# Patient Record
Sex: Male | Born: 1955
Health system: Southern US, Community
[De-identification: ages and names within clinical notes are randomized; demographics above are authoritative.]

## PROBLEM LIST (undated history)

## (undated) DIAGNOSIS — N08 Glomerular disorders in diseases classified elsewhere: Secondary | ICD-10-CM

## (undated) DIAGNOSIS — I10 Essential (primary) hypertension: Secondary | ICD-10-CM

---

## 2007-02-24 ENCOUNTER — Emergency Department (HOSPITAL_COMMUNITY): Admission: EM | Admit: 2007-02-24 | Discharge: 2007-02-24 | Payer: Self-pay | Admitting: Emergency Medicine

## 2019-06-16 ENCOUNTER — Ambulatory Visit: Payer: Medicare HMO | Attending: Internal Medicine

## 2019-06-16 DIAGNOSIS — Z23 Encounter for immunization: Secondary | ICD-10-CM | POA: Insufficient documentation

## 2019-06-16 NOTE — Progress Notes (Signed)
   Covid-19 Vaccination Clinic  Name:  Sergio Sims    MRN: [id number] DOB: November 26, 1955  06/16/2019  Mr. Sergio Sims was observed post Covid-19 immunization for 15 minutes without incidence. He was provided with Vaccine Information Sheet and instruction to access the V-Safe system.   Mr. Sergio Sims was instructed to call 911 with any severe reactions post vaccine: Marland Kitchen Difficulty breathing  . Swelling of your face and throat  . A fast heartbeat  . A bad rash all over your body  . Dizziness and weakness    Immunizations Administered    Name Date Dose VIS Date Route   Pfizer COVID-19 Vaccine 06/16/2019  8:17 AM 0.3 mL 03/31/2019 Intramuscular   Manufacturer: [place]   Lot: [id number]   [place]: [id number]

## 2019-07-11 ENCOUNTER — Ambulatory Visit: Payer: Medicare HMO | Attending: Internal Medicine

## 2019-07-12 ENCOUNTER — Ambulatory Visit: Payer: Self-pay | Attending: Internal Medicine

## 2019-07-12 DIAGNOSIS — Z23 Encounter for immunization: Secondary | ICD-10-CM

## 2019-07-12 NOTE — Progress Notes (Signed)
   Covid-19 Vaccination Clinic  Name:  Sergio Sims    MRN: [id number] DOB: [birth year]  07/12/2019  Mr. Sergio Sims was observed post Covid-19 immunization for 15 minutes without incident. He was provided with Vaccine Information Sheet and instruction to access the V-Safe system.   Mr. Sergio Sims was instructed to call 911 with any severe reactions post vaccine: Marland Kitchen Difficulty breathing  . Swelling of face and throat  . A fast heartbeat  . A bad rash all over body  . Dizziness and weakness   Immunizations Administered    Name Date Dose VIS Date Route   Pfizer COVID-19 Vaccine 07/12/2019  9:33 AM 0.3 mL 03/31/2019 Intramuscular   Manufacturer: [place]   Lot: [id number]   [place]: [id number]

## 2020-04-25 DIAGNOSIS — G4733 Obstructive sleep apnea (adult) (pediatric): Secondary | ICD-10-CM | POA: Diagnosis not present

## 2020-05-18 DIAGNOSIS — G4733 Obstructive sleep apnea (adult) (pediatric): Secondary | ICD-10-CM | POA: Diagnosis not present

## 2020-06-18 DIAGNOSIS — Z23 Encounter for immunization: Secondary | ICD-10-CM | POA: Diagnosis not present

## 2020-06-18 DIAGNOSIS — I10 Essential (primary) hypertension: Secondary | ICD-10-CM | POA: Diagnosis not present

## 2020-06-18 DIAGNOSIS — N529 Male erectile dysfunction, unspecified: Secondary | ICD-10-CM | POA: Diagnosis not present

## 2020-06-18 DIAGNOSIS — R972 Elevated prostate specific antigen [PSA]: Secondary | ICD-10-CM | POA: Diagnosis not present

## 2020-06-20 DIAGNOSIS — L814 Other melanin hyperpigmentation: Secondary | ICD-10-CM | POA: Diagnosis not present

## 2020-06-20 DIAGNOSIS — L570 Actinic keratosis: Secondary | ICD-10-CM | POA: Diagnosis not present

## 2020-06-20 DIAGNOSIS — L905 Scar conditions and fibrosis of skin: Secondary | ICD-10-CM | POA: Diagnosis not present

## 2020-06-20 DIAGNOSIS — L821 Other seborrheic keratosis: Secondary | ICD-10-CM | POA: Diagnosis not present

## 2020-06-20 DIAGNOSIS — D1801 Hemangioma of skin and subcutaneous tissue: Secondary | ICD-10-CM | POA: Diagnosis not present

## 2020-06-20 DIAGNOSIS — D225 Melanocytic nevi of trunk: Secondary | ICD-10-CM | POA: Diagnosis not present

## 2020-06-20 DIAGNOSIS — Z85828 Personal history of other malignant neoplasm of skin: Secondary | ICD-10-CM | POA: Diagnosis not present

## 2020-08-13 DIAGNOSIS — M549 Dorsalgia, unspecified: Secondary | ICD-10-CM | POA: Diagnosis not present

## 2020-08-13 DIAGNOSIS — R5383 Other fatigue: Secondary | ICD-10-CM | POA: Diagnosis not present

## 2020-08-13 DIAGNOSIS — R319 Hematuria, unspecified: Secondary | ICD-10-CM | POA: Diagnosis not present

## 2020-08-13 DIAGNOSIS — R61 Generalized hyperhidrosis: Secondary | ICD-10-CM | POA: Diagnosis not present

## 2020-08-19 DIAGNOSIS — J90 Pleural effusion, not elsewhere classified: Secondary | ICD-10-CM | POA: Diagnosis not present

## 2020-08-19 DIAGNOSIS — R918 Other nonspecific abnormal finding of lung field: Secondary | ICD-10-CM | POA: Diagnosis not present

## 2020-08-20 DIAGNOSIS — R319 Hematuria, unspecified: Secondary | ICD-10-CM | POA: Diagnosis not present

## 2020-08-20 DIAGNOSIS — R7989 Other specified abnormal findings of blood chemistry: Secondary | ICD-10-CM | POA: Diagnosis not present

## 2020-08-20 DIAGNOSIS — R61 Generalized hyperhidrosis: Secondary | ICD-10-CM | POA: Diagnosis not present

## 2020-08-22 DIAGNOSIS — R918 Other nonspecific abnormal finding of lung field: Secondary | ICD-10-CM | POA: Diagnosis not present

## 2020-08-22 DIAGNOSIS — J918 Pleural effusion in other conditions classified elsewhere: Secondary | ICD-10-CM | POA: Diagnosis not present

## 2020-08-23 DIAGNOSIS — J984 Other disorders of lung: Secondary | ICD-10-CM | POA: Diagnosis not present

## 2020-08-23 DIAGNOSIS — R7989 Other specified abnormal findings of blood chemistry: Secondary | ICD-10-CM | POA: Diagnosis not present

## 2020-08-23 DIAGNOSIS — R61 Generalized hyperhidrosis: Secondary | ICD-10-CM | POA: Diagnosis not present

## 2020-08-27 DIAGNOSIS — R61 Generalized hyperhidrosis: Secondary | ICD-10-CM | POA: Diagnosis not present

## 2020-08-27 DIAGNOSIS — R918 Other nonspecific abnormal finding of lung field: Secondary | ICD-10-CM | POA: Diagnosis not present

## 2020-08-27 DIAGNOSIS — J984 Other disorders of lung: Secondary | ICD-10-CM | POA: Diagnosis not present

## 2020-09-02 DIAGNOSIS — Z0100 Encounter for examination of eyes and vision without abnormal findings: Secondary | ICD-10-CM | POA: Diagnosis not present

## 2020-09-27 DIAGNOSIS — J984 Other disorders of lung: Secondary | ICD-10-CM | POA: Diagnosis not present

## 2020-09-27 DIAGNOSIS — I358 Other nonrheumatic aortic valve disorders: Secondary | ICD-10-CM | POA: Diagnosis not present

## 2020-09-27 DIAGNOSIS — I2510 Atherosclerotic heart disease of native coronary artery without angina pectoris: Secondary | ICD-10-CM | POA: Diagnosis not present

## 2020-09-27 DIAGNOSIS — R918 Other nonspecific abnormal finding of lung field: Secondary | ICD-10-CM | POA: Diagnosis not present

## 2020-10-04 DIAGNOSIS — Z87891 Personal history of nicotine dependence: Secondary | ICD-10-CM | POA: Diagnosis not present

## 2020-10-04 DIAGNOSIS — R918 Other nonspecific abnormal finding of lung field: Secondary | ICD-10-CM | POA: Diagnosis not present

## 2020-10-07 DIAGNOSIS — Z0100 Encounter for examination of eyes and vision without abnormal findings: Secondary | ICD-10-CM | POA: Diagnosis not present

## 2020-10-29 DIAGNOSIS — H6593 Unspecified nonsuppurative otitis media, bilateral: Secondary | ICD-10-CM | POA: Diagnosis not present

## 2020-10-29 DIAGNOSIS — G629 Polyneuropathy, unspecified: Secondary | ICD-10-CM | POA: Diagnosis not present

## 2020-10-29 DIAGNOSIS — H9191 Unspecified hearing loss, right ear: Secondary | ICD-10-CM | POA: Diagnosis not present

## 2020-10-29 DIAGNOSIS — M940 Chondrocostal junction syndrome [Tietze]: Secondary | ICD-10-CM | POA: Diagnosis not present

## 2020-10-29 DIAGNOSIS — R0600 Dyspnea, unspecified: Secondary | ICD-10-CM | POA: Diagnosis not present

## 2020-10-29 DIAGNOSIS — D509 Iron deficiency anemia, unspecified: Secondary | ICD-10-CM | POA: Diagnosis not present

## 2020-10-29 DIAGNOSIS — R7989 Other specified abnormal findings of blood chemistry: Secondary | ICD-10-CM | POA: Diagnosis not present

## 2020-10-29 DIAGNOSIS — R5383 Other fatigue: Secondary | ICD-10-CM | POA: Diagnosis not present

## 2020-10-29 DIAGNOSIS — R634 Abnormal weight loss: Secondary | ICD-10-CM | POA: Diagnosis not present

## 2020-10-31 DIAGNOSIS — R7989 Other specified abnormal findings of blood chemistry: Secondary | ICD-10-CM | POA: Diagnosis not present

## 2020-10-31 DIAGNOSIS — D509 Iron deficiency anemia, unspecified: Secondary | ICD-10-CM | POA: Diagnosis not present

## 2020-10-31 DIAGNOSIS — G629 Polyneuropathy, unspecified: Secondary | ICD-10-CM | POA: Diagnosis not present

## 2020-11-15 ENCOUNTER — Other Ambulatory Visit: Payer: Self-pay

## 2020-11-15 ENCOUNTER — Emergency Department (HOSPITAL_BASED_OUTPATIENT_CLINIC_OR_DEPARTMENT_OTHER): Payer: Medicare HMO

## 2020-11-15 ENCOUNTER — Emergency Department (HOSPITAL_BASED_OUTPATIENT_CLINIC_OR_DEPARTMENT_OTHER): Payer: Medicare HMO | Admitting: Radiology

## 2020-11-15 ENCOUNTER — Encounter (HOSPITAL_BASED_OUTPATIENT_CLINIC_OR_DEPARTMENT_OTHER): Payer: Self-pay | Admitting: Emergency Medicine

## 2020-11-15 ENCOUNTER — Observation Stay (HOSPITAL_BASED_OUTPATIENT_CLINIC_OR_DEPARTMENT_OTHER)
Admission: EM | Admit: 2020-11-15 | Discharge: 2020-11-17 | Disposition: A | Discharge status: Home / Self Care | Payer: Medicare HMO | Attending: Internal Medicine | Admitting: Internal Medicine

## 2020-11-15 DIAGNOSIS — Z20822 Contact with and (suspected) exposure to covid-19: Secondary | ICD-10-CM | POA: Diagnosis not present

## 2020-11-15 DIAGNOSIS — R634 Abnormal weight loss: Secondary | ICD-10-CM | POA: Diagnosis not present

## 2020-11-15 DIAGNOSIS — N179 Acute kidney failure, unspecified: Secondary | ICD-10-CM | POA: Diagnosis not present

## 2020-11-15 DIAGNOSIS — G629 Polyneuropathy, unspecified: Secondary | ICD-10-CM | POA: Diagnosis not present

## 2020-11-15 DIAGNOSIS — Z79899 Other long term (current) drug therapy: Secondary | ICD-10-CM | POA: Insufficient documentation

## 2020-11-15 DIAGNOSIS — I10 Essential (primary) hypertension: Secondary | ICD-10-CM | POA: Diagnosis not present

## 2020-11-15 DIAGNOSIS — H2000 Unspecified acute and subacute iridocyclitis: Secondary | ICD-10-CM | POA: Diagnosis not present

## 2020-11-15 DIAGNOSIS — D649 Anemia, unspecified: Secondary | ICD-10-CM | POA: Diagnosis not present

## 2020-11-15 DIAGNOSIS — H209 Unspecified iridocyclitis: Secondary | ICD-10-CM | POA: Diagnosis not present

## 2020-11-15 DIAGNOSIS — R911 Solitary pulmonary nodule: Secondary | ICD-10-CM | POA: Diagnosis not present

## 2020-11-15 DIAGNOSIS — R3129 Other microscopic hematuria: Secondary | ICD-10-CM | POA: Diagnosis not present

## 2020-11-15 DIAGNOSIS — R7989 Other specified abnormal findings of blood chemistry: Secondary | ICD-10-CM | POA: Diagnosis not present

## 2020-11-15 DIAGNOSIS — J90 Pleural effusion, not elsewhere classified: Secondary | ICD-10-CM | POA: Diagnosis not present

## 2020-11-15 DIAGNOSIS — R5383 Other fatigue: Secondary | ICD-10-CM | POA: Diagnosis not present

## 2020-11-15 DIAGNOSIS — N19 Unspecified kidney failure: Secondary | ICD-10-CM | POA: Diagnosis not present

## 2020-11-15 DIAGNOSIS — R799 Abnormal finding of blood chemistry, unspecified: Secondary | ICD-10-CM | POA: Diagnosis not present

## 2020-11-15 DIAGNOSIS — N289 Disorder of kidney and ureter, unspecified: Secondary | ICD-10-CM

## 2020-11-15 DIAGNOSIS — D3132 Benign neoplasm of left choroid: Secondary | ICD-10-CM | POA: Diagnosis not present

## 2020-11-15 LAB — URINALYSIS, ROUTINE W REFLEX MICROSCOPIC
Bilirubin Urine: NEGATIVE
Glucose, UA: NEGATIVE mg/dL
Ketones, ur: NEGATIVE mg/dL
Nitrite: NEGATIVE
Protein, ur: 100 mg/dL — AB
Specific Gravity, Urine: 1.018 (ref 1.005–1.030)
pH: 5.5 (ref 5.0–8.0)

## 2020-11-15 LAB — CBC
HCT: 30.6 % — ABNORMAL LOW (ref 39.0–52.0)
Hemoglobin: 9.7 g/dL — ABNORMAL LOW (ref 13.0–17.0)
MCH: 26.7 pg (ref 26.0–34.0)
MCHC: 31.7 g/dL (ref 30.0–36.0)
MCV: 84.3 fL (ref 80.0–100.0)
Platelets: 360 10*3/uL (ref 150–400)
RBC: 3.63 MIL/uL — ABNORMAL LOW (ref 4.22–5.81)
RDW: 16.4 % — ABNORMAL HIGH (ref 11.5–15.5)
WBC: 9 10*3/uL (ref 4.0–10.5)
nRBC: 0 % (ref 0.0–0.2)

## 2020-11-15 LAB — BASIC METABOLIC PANEL
Anion gap: 13 (ref 5–15)
BUN: 32 mg/dL — ABNORMAL HIGH (ref 8–23)
CO2: 22 mmol/L (ref 22–32)
Calcium: 9 mg/dL (ref 8.9–10.3)
Chloride: 101 mmol/L (ref 98–111)
Creatinine, Ser: 2.2 mg/dL — ABNORMAL HIGH (ref 0.61–1.24)
GFR, Estimated: 32 mL/min — ABNORMAL LOW (ref >60)
Glucose, Bld: 111 mg/dL — ABNORMAL HIGH (ref 70–99)
Potassium: 4.2 mmol/L (ref 3.5–5.1)
Sodium: 136 mmol/L (ref 135–145)

## 2020-11-15 LAB — RESP PANEL BY RT-PCR (FLU A&B, COVID) ARPGX2
Influenza A by PCR: NEGATIVE
Influenza B by PCR: NEGATIVE
SARS Coronavirus 2 by RT PCR: NEGATIVE

## 2020-11-15 LAB — CREATININE, URINE, RANDOM: Creatinine, Urine: 158.9 mg/dL

## 2020-11-15 LAB — SODIUM, URINE, RANDOM: Sodium, Ur: 52 mmol/L

## 2020-11-15 MED ORDER — SODIUM CHLORIDE 0.9 % IV BOLUS
1000.0000 mL | Freq: Once | INTRAVENOUS | Status: AC
  Administered 2020-11-15: 1000 mL via INTRAVENOUS

## 2020-11-15 NOTE — ED Notes (Signed)
States has been having a work up with his MD and told to come here for abnormal labs regarding his kidneys.  Denies SOB or any pain.  States has been having weakness for weeks.

## 2020-11-15 NOTE — ED Triage Notes (Signed)
Pt presents to ED POV. Pt reports that he was sent he because he was told by PCP he was going into renal failure. Pt pt denies any edema and/or difficulty breathing. Reports decreased urine output

## 2020-11-15 NOTE — Progress Notes (Signed)
New Admission Note:   Arrival Method: Arrived from [place] ED Mental Orientation: Alert and oriented x4 Telemetry: Box #14 Assessment: Completed Skin: Intact IV: Lt FA Pain: 0/10 Tubes: N/A Safety Measures: Safety Fall Prevention Plan has been discussed.  Admission: Completed 5MW Orientation: Patient has been oriented to the room, unit and staff.  Family: None at bedside  Orders have been reviewed and implemented. Will continue to monitor the patient. Call light has been placed within reach and bed alarm has been activated.   Breyonna Nault [employer], RN-BC Phone number: [phone]

## 2020-11-15 NOTE — ED Notes (Signed)
Attempt report to floor RN.  States will call back.  Handoff report given to [facility]

## 2020-11-15 NOTE — ED Notes (Signed)
Called [facility] to transport patient to [facility]M room 14

## 2020-11-15 NOTE — ED Notes (Signed)
US done

## 2020-11-15 NOTE — ED Provider Notes (Signed)
[facility] EMERGENCY DEPT Provider Note   CSN: [id number] Arrival date & time: 11/15/20  1508     History Chief Complaint  Patient presents with   abnormal labs    Sergio Sims is a 65 y.o. male.  HPI  65-year-old male presents the emergency department concern for new renal failure found on outpatient labs.  Patient reports over the last couple months since April he has been struggling with a right sided lung infection.  He has been on multiple courses of antibiotics.  Since this he has never felt back to normal.  He said decreased appetite, weight loss, decreased urine production.  And advised outpatient labs which were noted to be abnormal and he was referred here.  Patient denies any fever but endorses fatigue, chills.  Intermittent cough but denies any nausea/vomiting/diarrhea.  Admits to decreased urine output.  No specific chest pain or abdominal pain.  History reviewed. No pertinent past medical history.  There are no problems to display for this patient.   History reviewed. No pertinent surgical history.     History reviewed. No pertinent family history.     Home Medications Prior to Admission medications   Not on File    Allergies    Patient has no allergy information on record.  Review of Systems   Review of Systems  Constitutional:  Positive for appetite change, fatigue and unexpected weight change. Negative for chills and fever.  HENT:  Negative for congestion.   Eyes:  Negative for visual disturbance.  Respiratory:  Positive for cough. Negative for shortness of breath.   Cardiovascular:  Negative for chest pain, palpitations and leg swelling.  Gastrointestinal:  Negative for abdominal pain, diarrhea and vomiting.  Genitourinary:  Negative for dysuria.  Musculoskeletal:  Negative for back pain and neck pain.  Skin:  Negative for rash.  Neurological:  Positive for headaches.   Physical Exam Updated Vital Signs BP (!) 158/69 (BP  Location: Right Arm)   Pulse 75   Temp 98.6 F (37 C)   Resp 16   Ht 6' 2" (1.88 m)   Wt 92.5 kg   SpO2 100%   BMI 26.19 kg/m   Physical Exam Vitals and nursing note reviewed.  Constitutional:      General: He is not in acute distress.    Appearance: Normal appearance.  HENT:     Head: Normocephalic.     Mouth/Throat:     Mouth: Mucous membranes are moist.  Cardiovascular:     Rate and Rhythm: Normal rate.  Pulmonary:     Effort: Pulmonary effort is normal. No respiratory distress.     Comments: Diminished breath sounds on the right Abdominal:     Palpations: Abdomen is soft.     Tenderness: There is no abdominal tenderness.  Musculoskeletal:        General: No swelling or deformity.     Cervical back: No rigidity or tenderness.  Skin:    General: Skin is warm.  Neurological:     Mental Status: He is alert and oriented to person, place, and time. Mental status is at baseline.  Psychiatric:        Mood and Affect: Mood normal.    ED Results / Procedures / Treatments   Labs (all labs ordered are listed, but only abnormal results are displayed) Labs Reviewed  CBC - Abnormal; Notable for the following components:      Result Value   RBC 3.63 (*)    Hemoglobin 9.7 (*)      HCT 30.6 (*)    RDW 16.4 (*)    All other components within normal limits  BASIC METABOLIC PANEL - Abnormal; Notable for the following components:   Glucose, Bld 111 (*)    BUN 32 (*)    Creatinine, Ser 2.20 (*)    GFR, Estimated 32 (*)    All other components within normal limits  URINALYSIS, ROUTINE W REFLEX MICROSCOPIC - Abnormal; Notable for the following components:   Hgb urine dipstick LARGE (*)    Protein, ur 100 (*)    Leukocytes,Ua TRACE (*)    All other components within normal limits    EKG None  Radiology No results found.  Procedures Procedures   Medications Ordered in ED Medications - No data to display  ED Course  I have reviewed the triage vital signs and the  nursing notes.  Pertinent labs & imaging results that were available during my care of the patient were reviewed by me and considered in my medical decision making (see chart for details).    MDM Rules/Calculators/A&P                           65-year-old male presents the emergency department with knee dysfunction on outpatient labs.  Patient has been fighting a right-sided lung infection for the past couple months.  Since then has had a significant decline, weight loss and decreased p.o. intake.  Of note his ESR and CRP has been elevated as an outpatient.  Most of his care has been at [facility].  Vitals are stable here.  Lab work here confirms renal dysfunction, normal white blood cells, his baseline creatinine appears to be around 1.2 and today his creatinine is 2.2.  Urinalysis has leukocytes and blood but no infection.  Renal ultrasound is reassuring.  Chest x-ray shows right-sided pleural effusion.  Plan for hydration and urine studies as well as admission for further evaluation and treatment.  Patients evaluation and results requires admission for further treatment and care. Patient agrees with admission plan, offers no new complaints and is stable/unchanged at time of admit.  Final Clinical Impression(s) / ED Diagnoses Final diagnoses:  None    Rx / DC Orders ED Discharge Orders     None        Horton, Kristie M, DO 11/15/20 2031  

## 2020-11-15 NOTE — ED Notes (Signed)
Report given to Floor Rn 60M at [facility] cone.

## 2020-11-16 DIAGNOSIS — N179 Acute kidney failure, unspecified: Secondary | ICD-10-CM

## 2020-11-16 DIAGNOSIS — I10 Essential (primary) hypertension: Secondary | ICD-10-CM | POA: Diagnosis not present

## 2020-11-16 DIAGNOSIS — D649 Anemia, unspecified: Secondary | ICD-10-CM | POA: Diagnosis not present

## 2020-11-16 LAB — BASIC METABOLIC PANEL
Anion gap: 9 (ref 5–15)
BUN: 29 mg/dL — ABNORMAL HIGH (ref 8–23)
CO2: 22 mmol/L (ref 22–32)
Calcium: 8.5 mg/dL — ABNORMAL LOW (ref 8.9–10.3)
Chloride: 104 mmol/L (ref 98–111)
Creatinine, Ser: 2.31 mg/dL — ABNORMAL HIGH (ref 0.61–1.24)
GFR, Estimated: 30 mL/min — ABNORMAL LOW (ref >60)
Glucose, Bld: 104 mg/dL — ABNORMAL HIGH (ref 70–99)
Potassium: 4.4 mmol/L (ref 3.5–5.1)
Sodium: 135 mmol/L (ref 135–145)

## 2020-11-16 LAB — HEPATITIS PANEL, ACUTE
HCV Ab: NONREACTIVE
Hep A IgM: NONREACTIVE
Hep B C IgM: NONREACTIVE
Hepatitis B Surface Ag: NONREACTIVE

## 2020-11-16 LAB — CBC
HCT: 30.1 % — ABNORMAL LOW (ref 39.0–52.0)
Hemoglobin: 9.4 g/dL — ABNORMAL LOW (ref 13.0–17.0)
MCH: 27.1 pg (ref 26.0–34.0)
MCHC: 31.2 g/dL (ref 30.0–36.0)
MCV: 86.7 fL (ref 80.0–100.0)
Platelets: 317 10*3/uL (ref 150–400)
RBC: 3.47 MIL/uL — ABNORMAL LOW (ref 4.22–5.81)
RDW: 16 % — ABNORMAL HIGH (ref 11.5–15.5)
WBC: 6.1 10*3/uL (ref 4.0–10.5)
nRBC: 0 % (ref 0.0–0.2)

## 2020-11-16 LAB — PROTEIN / CREATININE RATIO, URINE
Creatinine, Urine: 78.45 mg/dL
Protein Creatinine Ratio: 0.69 mg/mg{Cre} — ABNORMAL HIGH (ref 0.00–0.15)
Total Protein, Urine: 54 mg/dL

## 2020-11-16 LAB — HIV ANTIBODY (ROUTINE TESTING W REFLEX): HIV Screen 4th Generation wRfx: NONREACTIVE

## 2020-11-16 MED ORDER — ENOXAPARIN SODIUM 40 MG/0.4ML IJ SOSY
40.0000 mg | PREFILLED_SYRINGE | Freq: Every day | INTRAMUSCULAR | Status: DC
  Administered 2020-11-16: 40 mg via SUBCUTANEOUS
  Filled 2020-11-16 (×3): qty 0.4

## 2020-11-16 MED ORDER — SODIUM CHLORIDE 0.9 % IV SOLN: INTRAVENOUS | Status: DC

## 2020-11-16 MED ORDER — AMLODIPINE BESYLATE 5 MG PO TABS
5.0000 mg | ORAL_TABLET | Freq: Every day | ORAL | Status: DC
  Administered 2020-11-17: 5 mg via ORAL
  Filled 2020-11-16 (×3): qty 1

## 2020-11-16 MED ORDER — SODIUM CHLORIDE 0.9 % IV BOLUS
1000.0000 mL | Freq: Once | INTRAVENOUS | Status: AC
  Administered 2020-11-16: 1000 mL via INTRAVENOUS

## 2020-11-16 MED ORDER — LACTATED RINGERS IV SOLN: INTRAVENOUS | Status: DC

## 2020-11-16 MED ORDER — ACETAMINOPHEN 325 MG PO TABS
650.0000 mg | ORAL_TABLET | Freq: Four times a day (QID) | ORAL | Status: DC | PRN
  Administered 2020-11-16 – 2020-11-17 (×4): 650 mg via ORAL
  Filled 2020-11-16 (×4): qty 2

## 2020-11-16 NOTE — Plan of Care (Signed)
  Problem: Education: Goal: Knowledge of General Education information will improve Description: Including pain rating scale, medication(s)/side effects and non-pharmacologic comfort measures Outcome: Progressing   Problem: Clinical Measurements: Goal: Ability to maintain clinical measurements within normal limits will improve Outcome: Progressing   Problem: Activity: Goal: Risk for activity intolerance will decrease Outcome: Progressing   Problem: Nutrition: Goal: Adequate nutrition will be maintained Outcome: Progressing   Problem: Education: Goal: Knowledge of disease and its progression will improve Outcome: Progressing

## 2020-11-16 NOTE — Care Management Obs Status (Signed)
[place] NOTIFICATION   Patient Details  Name: Sergio Sims MRN: [id number] Date of Birth: 11/12/1955   Medicare Observation Status Notification Given:  Yes    Bartholomew Crews, RN 11/16/2020, 6:21 PM

## 2020-11-16 NOTE — Consult Note (Addendum)
Renal Service Consult Note [facility]  Sergio Sims 11/16/2020 [name], MD Requesting Physician: Dr Nevada Crane, C.   Reason for Consult: Renal failure HPI: The patient is a 65 y.o. year-old w/ hx of HTN sent to ED at [facility] by his PCP for elevated creatinine. Pt c/o of dec'd appetite since dx of PNA 2 mos ago, also assoc wt loss 25 lbs, DOE w/ gen weakness.  In ED pt got 1 L bolus and tx'd to [facility] for admission. Renal US showed normal appearing kidneys. Was on acei at home , being held now. BP in ED was 160/80, R 75, RR 17, afeb, 100% on RA, creat 2.31, BUN 29  , K 4.4.  Asked to see for renal failure.   Pt had PNA about 6 wks ago, was not in hospital, took about 2 wks of po abx and recovered but since then has had multiple issues including gen weakness, wt loss 25 lbs, joint pains (took 10 d pred taper which helped), some numbness in the feet. No hx kidney issues, no change in color such as tea colored, urine is yellow. No difficutly voiding. Has not been eating well. Was taking acei inhibitor at home, now is on hold.     ROS - denies CP, no joint pain, no HA, no blurry vision, no rash, no diarrhea, no nausea/ vomiting, no dysuria, no difficulty voiding   Past Medical History History reviewed. No pertinent past medical history. Past Surgical History History reviewed. No pertinent surgical history. Family History History reviewed. No pertinent family history. Social History  has no history on file for tobacco use, alcohol use, and drug use. Allergies Not on File Home medications Prior to Admission medications   Not on File     Vitals:   11/15/20 1745 11/15/20 1915 11/15/20 2353 11/16/20 0800  BP: (!) 158/69 (!) 151/79 (!) 163/83 (!) 145/74  Pulse: 75 81 74 73  Resp: 16 18 17 19   Temp:   98.1 F (36.7 C) 97.7 F (36.5 C)  TempSrc:   Oral Oral  SpO2: 100% 98% 100% 100%  Weight:   91.7 kg   Height:   6' 1"  (1.854 m)    Exam Gen alert, no distress No  rash, cyanosis or gangrene Sclera anicteric, throat clear  No jvd or bruits Chest clear bilat to bases, no rales/ wheezing RRR no MRG Abd soft ntnd no mass or ascites +bs GU normal MS no joint effusions or deformity Ext no LE or UE edema, no wounds or ulcers Neuro is alert, Ox 3 , nf     Home meds include - ace inhibitor, now on hold    CXR - Previously seen small nodules by CT are not well visualized by plain film and should be followed by CT. Heart is normal size. Probable scarring in the lung bases. Small right pleural effusion again suspected, unchanged.    Renal US - 12 to 13 cm kidneys w/o hydro, + ^'d echo bilat, enlarged prostate    UA - 100 prot, gran casts, 11-20 rbcs, 0-5 wbc     UNa 52,  UCr 159     NO old creatinine      Na 135  K 4.4 CO2 22 BUN 29  Creat 2.31  eGFR 30       WBC 9K  Hb 9.7   plt 360     Assessment/ Plan: Renal failure - presumably acute, no old creat/ no hx renal failure. H/o HTN  only, acei is now on hold.  Has post PNA symptoms including 20 lb wt loss, joint pain and fatigue. UA shows gran casts and some microhematuria, 100 prot by dip. No obstruction on [place]. Will quantify proteinuria and send off serologies, he could have a GN post infectious. Could be vol depleted +ACEi is the other likely option. Will increase IVF's for now, f/u creat in am. Will follow.  HTN - no acei/ ARB, other agents ok Volume - slightly dry or euvolemic on exam Anemia - Hb 9.7, MCV normal      Sergio Buren Havey  MD 11/16/2020, 10:30 AM  Recent Labs  Lab 11/15/20 1525 11/16/20 0822  WBC 9.0 6.1  HGB 9.7* 9.4*   Recent Labs  Lab 11/15/20 1525 11/16/20 0204  K 4.2 4.4  BUN 32* 29*  CREATININE 2.20* 2.31*  CALCIUM 9.0 8.5*

## 2020-11-16 NOTE — H&P (Addendum)
History and Physical  Sergio Sims LFY:[id number] DOB: [birth year] DOA: 11/15/2020  Referring physician: Dr. Cyd Silence, [facility] PCP: Pcp, No  Outpatient Specialists: Pulmonary Patient coming from: Home through [facility] ED.  Chief Complaint: Abnormal lab.  HPI: Sergio Sims is a 65 y.o. male with medical history significant for essential hypertension, pulmonary nodules, right-sided pleural effusion, followed at [facility] pulmonology who was initially seen at [facility] ED at [facility] request due to abnormal lab with elevated creatinine.  Reports poor oral intake, loss of appetite, since diagnosis of pneumonia 2 months ago, associated with generalized weakness, exercise intolerance, dyspnea with minimal exertion, unintentional weight loss 25 pounds in the last 2 months.  Patient had a renal ultrasound done in the ED and received 1 L IV fluid hydration, normal saline bolus.  Was subsequently transferred to [facility] for further evaluation and management.  Renal ultrasound revealed evidence of medical renal disease.  No hydronephrosis or nephrolithiasis.  Proteinuria noted on UA.  He is on ACE inhibitor which were held due to AKI.  Admitted to the hospitalist service.  ED Course:  Temperature 98.6.  BP 163/83, pulse 74, respiratory 17, O2 saturation 100% on room air.  Lab studies remarkable for serum sodium 135, potassium 4.4, serum bicarb 22, glucose 104, BUN 29, creatinine 2.31, GFR 30.  Review of Systems: Review of systems as noted in the HPI. All other systems reviewed and are negative.   Social History:  has no history on file for tobacco use, alcohol use, and drug use.  History reviewed. No pertinent family history.   Home medications: Takes an ACE inhibitor.   Physical Exam: BP (!) 163/83 (BP Location: Right Arm)   Pulse 74   Temp 98.1 F (36.7 C) (Oral)   Resp 17   Ht 6\' 1"  (1.854 m)   Wt 91.7 kg   SpO2 100%   BMI 26.67 kg/m   General: 65 y.o. year-old male well  developed well nourished in no acute distress.  Alert and oriented x3. Cardiovascular: Regular rate and rhythm with no rubs or gallops.  No thyromegaly or JVD noted.  No lower extremity edema. 2/4 pulses in all 4 extremities. Respiratory: Clear to auscultation with no wheezes or rales. Good inspiratory effort. Abdomen: Soft nontender nondistended with normal bowel sounds x4 quadrants. Muskuloskeletal: No cyanosis, clubbing or edema noted bilaterally Neuro: CN II-XII intact, strength, sensation, reflexes Skin: No ulcerative lesions noted or rashes Psychiatry: Judgement and insight appear normal. Mood is appropriate for condition and setting          Labs on Admission:  Basic Metabolic Panel: Recent Labs  Lab 11/15/20 1525  NA 136  K 4.2  CL 101  CO2 22  GLUCOSE 111*  BUN 32*  CREATININE 2.20*  CALCIUM 9.0   Liver Function Tests: No results for input(s): AST, ALT, ALKPHOS, BILITOT, PROT, ALBUMIN in the last 168 hours. No results for input(s): LIPASE, AMYLASE in the last 168 hours. No results for input(s): AMMONIA in the last 168 hours. CBC: Recent Labs  Lab 11/15/20 1525  WBC 9.0  HGB 9.7*  HCT 30.6*  MCV 84.3  PLT 360   Cardiac Enzymes: No results for input(s): CKTOTAL, CKMB, CKMBINDEX, TROPONINI in the last 168 hours.  BNP (last 3 results) No results for input(s): BNP in the last 8760 hours.  ProBNP (last 3 results) No results for input(s): PROBNP in the last 8760 hours.  CBG: No results for input(s): GLUCAP in the last 168 hours.  Radiological Exams  on Admission: DG Chest 2 View  Result Date: 11/15/2020 CLINICAL DATA:  Recent infection EXAM: CHEST - 2 VIEW COMPARISON:  CT 09/27/2020 FINDINGS: Previously seen small nodules by CT are not well visualized by plain film and should be followed by CT. Heart is normal size. Probable scarring in the lung bases. Small right pleural effusion again suspected, unchanged. IMPRESSION: Bibasilar opacities, right greater than  left, favor scarring. Small right pleural effusion. Previously seen small pulmonary nodules not well visualized by plain films and can be followed with CT as clinically indicated. Electronically Signed   By: Rolm Baptise M.D.   On: 11/15/2020 19:07   US Renal  Result Date: 11/15/2020 CLINICAL DATA:  Renal failure. EXAM: RENAL / URINARY TRACT ULTRASOUND COMPLETE COMPARISON:  None. FINDINGS: Right Kidney: Renal measurements: 12.8 x 6.6 x 6.0 cm = volume: 262.7 mL. Echogenicity appears increased. Two simple cysts are seen measuring 1.0 and 2.3 cm. No solid mass or hydronephrosis visualized. Left Kidney: Renal measurements: 12.1 x 6.8 x 6.4 cm = volume: 276 mL. Echogenicity appears increased. 0.7 cm cyst noted. No solid mass or hydronephrosis visualized. Bladder: Appears normal for degree of bladder distention. Other: Prostate gland appears enlarged. IMPRESSION: Negative for hydronephrosis or acute abnormality. Increased cortical echogenicity compatible with medical renal disease. Electronically Signed   By: Inge Rise M.D.   On: 11/15/2020 18:54    EKG: I independently viewed the EKG done and my findings are as followed: None available at the time of this visit.  Assessment/Plan Present on Admission:  AKI (acute kidney injury) ([place])  Active Problems:   AKI (acute kidney injury) ([place])  AKI, no prior records to compare Presented with creatinine of 2.3 with GFR of 30, unclear baseline Renal ultrasound medical renal disease, no hydronephrosis. Avoid nephrotoxic agent, dehydration and hypotension. IV fluid hydration lactated Ringer 75 cc/h x 1 day. Monitor urine output Repeat BMP in the morning  Unintentional weight loss, unclear etiology Chronic normocytic anemia Presented with hemoglobin of 9.7 Self reported in the last year and half had a colonoscopy for which polyps were removed by Dr. Benson Norway. Monitor H&H Consider close follow-up appointment with Dr. Benson Norway after discharge  Iron  deficiency anemia Hemoglobin 9.7 Follows with GI outpatient No overt bleeding Monitor H&H  Essential hypertension Continue to hold off home oral ACE inhibitor Started on Norvasc 5 mg daily Continue to monitor vital signs   DVT prophylaxis: SCDs  Code Status: Full code  Family Communication: None at bedside  Disposition Plan: Admitted to [place] unit  Consults called: None  Admission status: Observation status   Status is: Observation   Dispo: The patient is from: Home.               Anticipated d/c is to: Home, possibly on 11/17/2020.              Patient currently not stable for discharge.   Difficult to place patient, not applicable.       Kayleen Memos MD [facility] Pager [phone]  If 7PM-7AM, please contact night-coverage [email or web address] Password [email or web address]  11/16/2020, 2:58 AM

## 2020-11-16 NOTE — Progress Notes (Signed)
Brief same-day note:  Patient is a 65 year old male with history of hypertension, pulm nodules, right-sided pleural effusion following with [facility] pulmonology who was sent as a direct admission for [place] after his PCP referred him to the ED for the evaluation of abnormal renal function.  Patient reported poor oral intake, loss of appetite, weakness, unintentional weight loss of 25 pounds in the last 2 months. Patient was recently treated with oral antibiotics for pneumonia by his pulmonologist. Renal ultrasound done in the emergency department was consistent with medical renal disease ,no hydronephrosis or nephrolithiasis.  Urinalysis with proteinuria.  He was also taking ACE inhibitor at home/but I do not see records on the med rec. Patient creatinine is in the range of 2.4 today.  And he is hemodynamically stable.  Potassium level is normal. I have requested for nephrology consultation.  His baseline creatinine is almost normal, ranging from 1-1.4. His AKI could be secondary to ACE inhibitor use or could be from acute interstitial nephritis secondary to recent antibiotic use. Urine sodium is more than 20, fluids have been stopped. He does not have any breathing problems.  Chest x-ray done on this admission showed bibasilar opacities favoring scaring, small right-sided pleural effusion,, no pneumonia.  He is saturating fine on room air and his lungs are clear on examination. Will continue current management.  We will check recommendation from nephrology.  Patient is eager to go home soon.

## 2020-11-17 DIAGNOSIS — D649 Anemia, unspecified: Secondary | ICD-10-CM | POA: Diagnosis not present

## 2020-11-17 DIAGNOSIS — I10 Essential (primary) hypertension: Secondary | ICD-10-CM | POA: Diagnosis not present

## 2020-11-17 DIAGNOSIS — N179 Acute kidney failure, unspecified: Secondary | ICD-10-CM | POA: Diagnosis not present

## 2020-11-17 LAB — CBC
HCT: 28.1 % — ABNORMAL LOW (ref 39.0–52.0)
Hemoglobin: 8.9 g/dL — ABNORMAL LOW (ref 13.0–17.0)
MCH: 27.2 pg (ref 26.0–34.0)
MCHC: 31.7 g/dL (ref 30.0–36.0)
MCV: 85.9 fL (ref 80.0–100.0)
Platelets: 339 10*3/uL (ref 150–400)
RBC: 3.27 MIL/uL — ABNORMAL LOW (ref 4.22–5.81)
RDW: 15.9 % — ABNORMAL HIGH (ref 11.5–15.5)
WBC: 6.7 10*3/uL (ref 4.0–10.5)
nRBC: 0 % (ref 0.0–0.2)

## 2020-11-17 LAB — BASIC METABOLIC PANEL
Anion gap: 7 (ref 5–15)
BUN: 27 mg/dL — ABNORMAL HIGH (ref 8–23)
CO2: 23 mmol/L (ref 22–32)
Calcium: 8.4 mg/dL — ABNORMAL LOW (ref 8.9–10.3)
Chloride: 106 mmol/L (ref 98–111)
Creatinine, Ser: 2.05 mg/dL — ABNORMAL HIGH (ref 0.61–1.24)
GFR, Estimated: 35 mL/min — ABNORMAL LOW (ref >60)
Glucose, Bld: 97 mg/dL (ref 70–99)
Potassium: 4.2 mmol/L (ref 3.5–5.1)
Sodium: 136 mmol/L (ref 135–145)

## 2020-11-17 LAB — C4 COMPLEMENT: Complement C4, Body Fluid: 36 mg/dL (ref 12–38)

## 2020-11-17 LAB — UREA NITROGEN, URINE: Urea Nitrogen, Ur: 632 mg/dL

## 2020-11-17 LAB — C3 COMPLEMENT: C3 Complement: 148 mg/dL (ref 82–167)

## 2020-11-17 LAB — ANTISTREPTOLYSIN O TITER: ASO: <20 IU/mL (ref 0.0–200.0)

## 2020-11-17 MED ORDER — SODIUM CHLORIDE 0.9 % IV SOLN
510.0000 mg | Freq: Once | INTRAVENOUS | Status: AC
  Administered 2020-11-17: 510 mg via INTRAVENOUS
  Filled 2020-11-17: qty 17

## 2020-11-17 MED ORDER — AMLODIPINE BESYLATE 5 MG PO TABS: 5.0000 mg | ORAL_TABLET | Freq: Every day | ORAL | 1 refills | Status: AC

## 2020-11-17 MED ORDER — FERROUS SULFATE 325 (65 FE) MG PO TABS: 325.0000 mg | ORAL_TABLET | Freq: Every day | ORAL | 1 refills | Status: AC

## 2020-11-17 NOTE — Plan of Care (Signed)
  Problem: Education: Goal: Knowledge of General Education information will improve Description: Including pain rating scale, medication(s)/side effects and non-pharmacologic comfort measures Outcome: Completed/Met   Problem: Health Behavior/Discharge Planning: Goal: Ability to manage health-related needs will improve Outcome: Completed/Met   Problem: Clinical Measurements: Goal: Ability to maintain clinical measurements within normal limits will improve Outcome: Completed/Met Goal: Will remain free from infection Outcome: Completed/Met Goal: Diagnostic test results will improve Outcome: Completed/Met Goal: Respiratory complications will improve Outcome: Completed/Met Goal: Cardiovascular complication will be avoided Outcome: Completed/Met   Problem: Activity: Goal: Risk for activity intolerance will decrease Outcome: Completed/Met   Problem: Nutrition: Goal: Adequate nutrition will be maintained Outcome: Completed/Met   Problem: Coping: Goal: Level of anxiety will decrease Outcome: Completed/Met   Problem: Elimination: Goal: Will not experience complications related to bowel motility Outcome: Completed/Met Goal: Will not experience complications related to urinary retention Outcome: Completed/Met   Problem: Pain Managment: Goal: General experience of comfort will improve Outcome: Completed/Met   Problem: Safety: Goal: Ability to remain free from injury will improve Outcome: Completed/Met   Problem: Skin Integrity: Goal: Risk for impaired skin integrity will decrease Outcome: Completed/Met   Problem: Education: Goal: Knowledge of disease and its progression will improve Outcome: Completed/Met   Problem: Health Behavior/Discharge Planning: Goal: Ability to manage health-related needs will improve Outcome: Completed/Met   Problem: Clinical Measurements: Goal: Complications related to the disease process or treatment will be avoided or minimized Outcome:  Completed/Met Goal: Dialysis access will remain free of complications Outcome: Completed/Met   Problem: Activity: Goal: Activity intolerance will improve Outcome: Completed/Met   Problem: Fluid Volume: Goal: Fluid volume balance will be maintained or improved Outcome: Completed/Met   Problem: Nutritional: Goal: Ability to make appropriate dietary choices will improve Outcome: Completed/Met   Problem: Respiratory: Goal: Respiratory symptoms related to disease process will be avoided Outcome: Completed/Met   Problem: Self-Concept: Goal: Body image disturbance will be avoided or minimized Outcome: Completed/Met   Problem: Urinary Elimination: Goal: Progression of disease will be identified and treated Outcome: Completed/Met

## 2020-11-17 NOTE — Progress Notes (Signed)
DISCHARGE NOTE HOME Aquilla Voiles to be discharged Home per MD order. Discussed prescriptions and follow up appointments with the patient. Prescriptions given to patient; medication list explained in detail. Patient verbalized understanding.  Skin clean, dry and intact without evidence of skin break down, no evidence of skin tears noted. IV catheter discontinued intact. Site without signs and symptoms of complications. Dressing and pressure applied. Pt denies pain at the site currently. No complaints noted.  Patient free of lines, drains, and wounds.   An After Visit Summary (AVS) was printed and given to the patient. Patient escorted via wheelchair, and discharged home via private auto.  Vira Agar, RN

## 2020-11-17 NOTE — Discharge Summary (Signed)
Physician Discharge Summary  Sergio Sims CZY:[id number] DOB: Aug 25, 1955 DOA: 11/15/2020  PCP: Pcp, No  Admit date: 11/15/2020 Discharge date: 11/17/2020  Admitted From: Home Disposition:  Home  Discharge Condition:Stable CODE STATUS:FULL Diet recommendation: Heart Healthy    Brief/Interim Summary:  Patient is a 65 year old male with history of hypertension, pulm nodules, right-sided pleural effusion following with [facility] pulmonology who was sent as a direct admission for [place] after his PCP referred him to the ED for the evaluation of abnormal renal function.  Patient reported poor oral intake, loss of appetite, weakness, unintentional weight loss of 25 pounds in the last 2 months. Patient was recently treated with oral antibiotics for pneumonia by his pulmonologist. Renal ultrasound done in the emergency department was consistent with medical renal disease ,no hydronephrosis or nephrolithiasis.  Urinalysis with proteinuria.  He was also taking ACE inhibitor at home/but we did not  not see records on the med rec.we requested for nephrology consultation.  His baseline creatinine is almost normal, ranging from 1-1.4.  Kidney function has started trending down.  He will follow-up with nephrology as an outpatient.  He is medically stable for discharge home today.  Following problems were addressed during his hospitalization:  AKI: Having good urine output, potassium level normal.  Creatinine in the range of 2, trending down. Nephrology was consulted here.  Hepatitis panel negative.  Pending ANCA, anti-DNA, ANA, kappa/lambda, complements, and NT streptolysin, GBM antibody, electrophoresis. He will follow with nephrology as an outpatient. Recommended to follow-up with PCP in a week and do a BMP test  History of lung nodules: Chest x-ray done on this admission showed bibasilar opacities favoring scaring, small right-sided pleural effusion,, no pneumonia.  He is saturating  fine on room air and his lungs are clear on examination.  He follows with pulmonology at [facility]  Hypertension: Patient's shd not take ARB or ACE inhibitor at home.  Started on amlodipine.  Blood pressure stable  Normocytic anemia: Hemoglobin has dropped from normal to the range of 9-8 in the last few months.  Recent iron panel showed severe iron deficiency.  FOBT was negative.  He denies change in the color of the stool.  We gave him a dose of IV iron, needs to continue oral supplementation on discharge.    Discharge Diagnoses:  Active Problems:   AKI (acute kidney injury) [facility])    Discharge Instructions  Discharge Instructions     Diet - low sodium heart healthy   Complete by: As directed    Discharge instructions   Complete by: As directed    1)Please take prescribed medications as instructed. 2)Follow up with your PCP in a week.  Do a CBC, BMP tests during the follow-up 3)You will be called by nephrology for the follow-up appointment.   Increase activity slowly   Complete by: As directed       Allergies as of 11/17/2020   Not on File      Medication List     TAKE these medications    acetaminophen 500 MG tablet Commonly known as: TYLENOL Take 500 mg by mouth every 6 (six) hours as needed for mild pain.   amLODipine 5 MG tablet Commonly known as: NORVASC Take 1 tablet (5 mg total) by mouth daily.   ferrous sulfate 325 (65 FE) MG tablet Take 1 tablet (325 mg total) by mouth daily.        Not on File  Consultations: nephrology   Procedures/Studies: [facility] Chest 2 View  Result Date: 11/15/2020 CLINICAL DATA:  Recent infection EXAM: CHEST - 2 VIEW COMPARISON:  CT 09/27/2020 FINDINGS: Previously seen small nodules by CT are not well visualized by plain film and should be followed by CT. Heart is normal size. Probable scarring in the lung bases. Small right pleural effusion again suspected, unchanged. IMPRESSION: Bibasilar opacities, right greater than left,  favor scarring. Small right pleural effusion. Previously seen small pulmonary nodules not well visualized by plain films and can be followed with CT as clinically indicated. Electronically Signed   By: Rolm Baptise M.D.   On: 11/15/2020 19:07   US Renal  Result Date: 11/15/2020 CLINICAL DATA:  Renal failure. EXAM: RENAL / URINARY TRACT ULTRASOUND COMPLETE COMPARISON:  None. FINDINGS: Right Kidney: Renal measurements: 12.8 x 6.6 x 6.0 cm = volume: 262.7 mL. Echogenicity appears increased. Two simple cysts are seen measuring 1.0 and 2.3 cm. No solid mass or hydronephrosis visualized. Left Kidney: Renal measurements: 12.1 x 6.8 x 6.4 cm = volume: 276 mL. Echogenicity appears increased. 0.7 cm cyst noted. No solid mass or hydronephrosis visualized. Bladder: Appears normal for degree of bladder distention. Other: Prostate gland appears enlarged. IMPRESSION: Negative for hydronephrosis or acute abnormality. Increased cortical echogenicity compatible with medical renal disease. Electronically Signed   By: Inge Rise M.D.   On: 11/15/2020 18:54      Subjective: Patient seen and examined the bedside this morning.  Hemodynamically stable for discharge today.  Discharge planning discussed with the wife at the bedside  Discharge Exam: Vitals:   11/17/20 0501 11/17/20 0945  BP: (!) 144/89 (!) 147/70  Pulse: 78 73  Resp: 16 16  Temp: 98.1 F (36.7 C) 98.6 F (37 C)  SpO2: 99% 96%   Vitals:   11/16/20 2059 11/17/20 0500 11/17/20 0501 11/17/20 0945  BP: (!) 151/74  (!) 144/89 (!) 147/70  Pulse: 69  78 73  Resp: 16  16 16   Temp: 98.2 F (36.8 C)  98.1 F (36.7 C) 98.6 F (37 C)  TempSrc: Oral  Oral   SpO2: 97%  99% 96%  Weight:  92.9 kg    Height:        General: Pt is alert, awake, not in acute distress Cardiovascular: RRR, S1/S2 +, no rubs, no gallops Respiratory: CTA bilaterally, no wheezing, no rhonchi Abdominal: Soft, NT, ND, bowel sounds + Extremities: no edema, no  cyanosis    The results of significant diagnostics from this hospitalization (including imaging, microbiology, ancillary and laboratory) are listed below for reference.     Microbiology: Recent Results (from the past 240 hour(s))  Resp Panel by RT-PCR (Flu A&B, Covid) Nasopharyngeal Swab     Status: None   Collection Time: 11/15/20  6:35 PM   Specimen: Nasopharyngeal Swab; Nasopharyngeal(NP) swabs in vial transport medium  Result Value Ref Range Status   SARS Coronavirus 2 by RT PCR NEGATIVE NEGATIVE Final    Comment: (NOTE) SARS-CoV-2 target nucleic acids are NOT DETECTED.  The SARS-CoV-2 RNA is generally detectable in upper respiratory specimens during the acute phase of infection. The lowest concentration of SARS-CoV-2 viral copies this assay can detect is 138 copies/mL. A negative result does not preclude SARS-Cov-2 infection and should not be used as the sole basis for treatment or other patient management decisions. A negative result may occur with  improper specimen collection/handling, submission of specimen other than nasopharyngeal swab, presence of viral mutation(s) within the areas targeted by this assay, and inadequate number of viral copies(<138 copies/mL). A negative  result must be combined with clinical observations, patient history, and epidemiological information. The expected result is Negative.  Fact Sheet for Patients:  EntrepreneurPulse.com.au  Fact Sheet for Healthcare Providers:  IncredibleEmployment.be  This test is no t yet approved or cleared by the [place] FDA and  has been authorized for detection and/or diagnosis of SARS-CoV-2 by FDA under an Emergency Use Authorization (EUA). This EUA will remain  in effect (meaning this test can be used) for the duration of the COVID-19 declaration under Section 564(b)(1) of the Act, 21 U.S.C.section 360bbb-3(b)(1), unless the authorization is terminated  or revoked  sooner.       Influenza A by PCR NEGATIVE NEGATIVE Final   Influenza B by PCR NEGATIVE NEGATIVE Final    Comment: (NOTE) The Xpert Xpress SARS-CoV-2/FLU/RSV plus assay is intended as an aid in the diagnosis of influenza from Nasopharyngeal swab specimens and should not be used as a sole basis for treatment. Nasal washings and aspirates are unacceptable for Xpert Xpress SARS-CoV-2/FLU/RSV testing.  Fact Sheet for Patients: EntrepreneurPulse.com.au  Fact Sheet for Healthcare Providers: IncredibleEmployment.be  This test is not yet approved or cleared by the [place] FDA and has been authorized for detection and/or diagnosis of SARS-CoV-2 by FDA under an Emergency Use Authorization (EUA). This EUA will remain in effect (meaning this test can be used) for the duration of the COVID-19 declaration under Section 564(b)(1) of the Act, 21 U.S.C. section 360bbb-3(b)(1), unless the authorization is terminated or revoked.  Performed at [facility], [address] [place], [place] [address]      Labs: BNP (last 3 results) No results for input(s): BNP in the last 8760 hours. Basic Metabolic Panel: Recent Labs  Lab 11/15/20 1525 11/16/20 0204 11/17/20 0224  NA 136 135 136  K 4.2 4.4 4.2  CL 101 104 106  CO2 22 22 23   GLUCOSE 111* 104* 97  BUN 32* 29* 27*  CREATININE 2.20* 2.31* 2.05*  CALCIUM 9.0 8.5* 8.4*   Liver Function Tests: No results for input(s): AST, ALT, ALKPHOS, BILITOT, PROT, ALBUMIN in the last 168 hours. No results for input(s): LIPASE, AMYLASE in the last 168 hours. No results for input(s): AMMONIA in the last 168 hours. CBC: Recent Labs  Lab 11/15/20 1525 11/16/20 0822 11/17/20 0224  WBC 9.0 6.1 6.7  HGB 9.7* 9.4* 8.9*  HCT 30.6* 30.1* 28.1*  MCV 84.3 86.7 85.9  PLT 360 317 339   Cardiac Enzymes: No results for input(s): CKTOTAL, CKMB, CKMBINDEX, TROPONINI in the last 168  hours. BNP: Invalid input(s): POCBNP CBG: No results for input(s): GLUCAP in the last 168 hours. D-Dimer No results for input(s): DDIMER in the last 72 hours. Hgb A1c No results for input(s): HGBA1C in the last 72 hours. Lipid Profile No results for input(s): CHOL, HDL, LDLCALC, TRIG, CHOLHDL, LDLDIRECT in the last 72 hours. Thyroid function studies No results for input(s): TSH, T4TOTAL, T3FREE, THYROIDAB in the last 72 hours.  Invalid input(s): FREET3 Anemia work up No results for input(s): VITAMINB12, FOLATE, FERRITIN, TIBC, IRON, RETICCTPCT in the last 72 hours. Urinalysis    Component Value Date/Time   COLORURINE YELLOW 11/15/2020 1655   APPEARANCEUR CLEAR 11/15/2020 1655   LABSPEC 1.018 11/15/2020 1655   PHURINE 5.5 11/15/2020 1655   GLUCOSEU NEGATIVE 11/15/2020 1655   HGBUR LARGE (A) 11/15/2020 1655   BILIRUBINUR NEGATIVE 11/15/2020 [place] 11/15/2020 1655   PROTEINUR 100 (A) 11/15/2020 [place] 11/15/2020 1655  LEUKOCYTESUR TRACE (A) 11/15/2020 1655   Sepsis Labs Invalid input(s): PROCALCITONIN,  WBC,  LACTICIDVEN Microbiology Recent Results (from the past 240 hour(s))  Resp Panel by RT-PCR (Flu A&B, Covid) Nasopharyngeal Swab     Status: None   Collection Time: 11/15/20  6:35 PM   Specimen: Nasopharyngeal Swab; Nasopharyngeal(NP) swabs in vial transport medium  Result Value Ref Range Status   SARS Coronavirus 2 by RT PCR NEGATIVE NEGATIVE Final    Comment: (NOTE) SARS-CoV-2 target nucleic acids are NOT DETECTED.  The SARS-CoV-2 RNA is generally detectable in upper respiratory specimens during the acute phase of infection. The lowest concentration of SARS-CoV-2 viral copies this assay can detect is 138 copies/mL. A negative result does not preclude SARS-Cov-2 infection and should not be used as the sole basis for treatment or other patient management decisions. A negative result may occur with  improper specimen  collection/handling, submission of specimen other than nasopharyngeal swab, presence of viral mutation(s) within the areas targeted by this assay, and inadequate number of viral copies(<138 copies/mL). A negative result must be combined with clinical observations, patient history, and epidemiological information. The expected result is Negative.  Fact Sheet for Patients:  EntrepreneurPulse.com.au  Fact Sheet for Healthcare Providers:  IncredibleEmployment.be  This test is no t yet approved or cleared by the [place] FDA and  has been authorized for detection and/or diagnosis of SARS-CoV-2 by FDA under an Emergency Use Authorization (EUA). This EUA will remain  in effect (meaning this test can be used) for the duration of the COVID-19 declaration under Section 564(b)(1) of the Act, 21 U.S.C.section 360bbb-3(b)(1), unless the authorization is terminated  or revoked sooner.       Influenza A by PCR NEGATIVE NEGATIVE Final   Influenza B by PCR NEGATIVE NEGATIVE Final    Comment: (NOTE) The Xpert Xpress SARS-CoV-2/FLU/RSV plus assay is intended as an aid in the diagnosis of influenza from Nasopharyngeal swab specimens and should not be used as a sole basis for treatment. Nasal washings and aspirates are unacceptable for Xpert Xpress SARS-CoV-2/FLU/RSV testing.  Fact Sheet for Patients: EntrepreneurPulse.com.au  Fact Sheet for Healthcare Providers: IncredibleEmployment.be  This test is not yet approved or cleared by the [place] FDA and has been authorized for detection and/or diagnosis of SARS-CoV-2 by FDA under an Emergency Use Authorization (EUA). This EUA will remain in effect (meaning this test can be used) for the duration of the COVID-19 declaration under Section 564(b)(1) of the Act, 21 U.S.C. section 360bbb-3(b)(1), unless the authorization is terminated or revoked.  Performed at [facility], [address], [place], [place] [address]     Please note: You were cared for by a hospitalist during your hospital stay. Once you are discharged, your primary care physician will handle any further medical issues. Please note that NO REFILLS for any discharge medications will be authorized once you are discharged, as it is imperative that you return to your primary care physician (or establish a relationship with a primary care physician if you do not have one) for your post hospital discharge needs so that they can reassess your need for medications and monitor your lab values.    Time coordinating discharge: 40 minutes  SIGNED:   [name], MD  [facility] 11/17/2020, 10:45 AM Pager [phone]  If 7PM-7AM, please contact night-coverage [email or web address] Password [email or web address]

## 2020-11-17 NOTE — Progress Notes (Signed)
[facility] Progress Note  Subjective: doing well, no new c/o  Vitals:   11/16/20 2059 11/17/20 0500 11/17/20 0501 11/17/20 0945  BP: (!) 151/74  (!) 144/89 (!) 147/70  Pulse: 69  78 73  Resp: 16  16 16   Temp: 98.2 F (36.8 C)  98.1 F (36.7 C) 98.6 F (37 C)  TempSrc: Oral  Oral   SpO2: 97%  99% 96%  Weight:  92.9 kg    Height:        Exam: Gen alert, no distress No rash, cyanosis or gangrene Sclera anicteric, throat clear No jvd or bruits Chest clear bilat to bases, no rales/ wheezing RRR no MRG Abd soft ntnd no mass or ascites +bs GU normal MS no joint effusions or deformity Ext no LE or UE edema, no wounds or ulcers Neuro is alert, Ox 3 , nf      Home meds include - ace inhibitor, now on hold     CXR - Previously seen small nodules by CT are not well visualized by plain film and should be followed by CT. Heart is normal size. Probable scarring in the lung bases. Small right pleural effusion again suspected, unchanged.    Renal US - 12 to 13 cm kidneys w/o hydro, + ^'d echo bilat, enlarged prostate    UA - 100 prot, gran casts, 11-20 rbcs, 0-5 wbc     UNa 52,  UCr 159     NO old creatinine     Na 135  K 4.4 CO2 22 BUN 29  Creat 2.31  eGFR 30       WBC 9K  Hb 9.7   plt 360       Assessment/ Plan: Renal failure - presumably acute, no old creat/ no hx renal failure. H/o HTN only, acei is now on hold.  Has post PNA symptoms including 20 lb wt loss, joint pain and fatigue. UA shows gran casts and some microhematuria, 100 prot by dip. No obstruction on [place]. AKI could be ACEi + vol depletion, but also need to r/o out GN. Serologies sent. Creat down slightly today. Cont to hold ACEi / ARB for this patient. Our office will set up 2 wk visit to f/u renal function and serologic testing. OK for dc home.  HTN - no acei/ ARB, other agents ok Volume - slightly dry or euvolemic on exam, got IVF's overnight Anemia - Hb 9.7, MCV normal      Rob  Aniken Monestime 11/17/2020, 1:04 PM   Recent Labs  Lab 11/16/20 0204 11/16/20 0822 11/17/20 0224  K 4.4  --  4.2  BUN 29*  --  27*  CREATININE 2.31*  --  2.05*  CALCIUM 8.5*  --  8.4*  HGB  --  9.4* 8.9*   Inpatient medications:  amLODipine  5 mg Oral Daily   enoxaparin (LOVENOX) injection  40 mg Subcutaneous Daily    acetaminophen

## 2020-11-18 LAB — PROTEIN ELECTROPHORESIS, SERUM
A/G Ratio: 0.7 (ref 0.7–1.7)
Albumin ELP: 2.6 g/dL — ABNORMAL LOW (ref 2.9–4.4)
Alpha-1-Globulin: 0.4 g/dL (ref 0.0–0.4)
Alpha-2-Globulin: 1 g/dL (ref 0.4–1.0)
Beta Globulin: 1.1 g/dL (ref 0.7–1.3)
Gamma Globulin: 1 g/dL (ref 0.4–1.8)
Globulin, Total: 3.6 g/dL (ref 2.2–3.9)
Total Protein ELP: 6.2 g/dL (ref 6.0–8.5)

## 2020-11-18 LAB — KAPPA/LAMBDA LIGHT CHAINS
Kappa free light chain: 98.5 mg/L — ABNORMAL HIGH (ref 3.3–19.4)
Kappa, lambda light chain ratio: 1.63 (ref 0.26–1.65)
Lambda free light chains: 60.4 mg/L — ABNORMAL HIGH (ref 5.7–26.3)

## 2020-11-18 LAB — ANTI-DNA ANTIBODY, DOUBLE-STRANDED: ds DNA Ab: <1 IU/mL (ref 0–9)

## 2020-11-18 LAB — GLOMERULAR BASEMENT MEMBRANE ANTIBODIES: GBM Ab: <0.2 units (ref 0.0–0.9)

## 2020-11-19 LAB — UREA NITROGEN, URINE: Urea Nitrogen, Ur: 539 mg/dL

## 2020-11-20 LAB — ANCA PROFILE
Anti-MPO Antibodies: <0.2 units (ref 0.0–0.9)
Anti-PR3 Antibodies: >8 units — ABNORMAL HIGH (ref 0.0–0.9)
Atypical P-ANCA titer: <1:20 {titer}
C-ANCA: 1:320 {titer} — ABNORMAL HIGH
P-ANCA: <1:20 {titer}

## 2020-11-21 LAB — ANTINUCLEAR ANTIBODIES, IFA: ANA Ab, IFA: NEGATIVE

## 2020-11-22 DIAGNOSIS — H2000 Unspecified acute and subacute iridocyclitis: Secondary | ICD-10-CM | POA: Diagnosis not present

## 2020-11-22 DIAGNOSIS — D3132 Benign neoplasm of left choroid: Secondary | ICD-10-CM | POA: Diagnosis not present

## 2020-11-25 DIAGNOSIS — H209 Unspecified iridocyclitis: Secondary | ICD-10-CM | POA: Diagnosis not present

## 2020-11-25 DIAGNOSIS — R768 Other specified abnormal immunological findings in serum: Secondary | ICD-10-CM | POA: Diagnosis not present

## 2020-11-25 DIAGNOSIS — R0600 Dyspnea, unspecified: Secondary | ICD-10-CM | POA: Diagnosis not present

## 2020-11-25 DIAGNOSIS — R634 Abnormal weight loss: Secondary | ICD-10-CM | POA: Diagnosis not present

## 2020-11-25 DIAGNOSIS — N289 Disorder of kidney and ureter, unspecified: Secondary | ICD-10-CM | POA: Diagnosis not present

## 2020-11-27 DIAGNOSIS — H15009 Unspecified scleritis, unspecified eye: Secondary | ICD-10-CM | POA: Diagnosis not present

## 2020-11-27 DIAGNOSIS — N179 Acute kidney failure, unspecified: Secondary | ICD-10-CM | POA: Diagnosis not present

## 2020-11-27 DIAGNOSIS — R918 Other nonspecific abnormal finding of lung field: Secondary | ICD-10-CM | POA: Diagnosis not present

## 2020-11-27 DIAGNOSIS — N059 Unspecified nephritic syndrome with unspecified morphologic changes: Secondary | ICD-10-CM | POA: Diagnosis not present

## 2020-11-27 DIAGNOSIS — R0981 Nasal congestion: Secondary | ICD-10-CM | POA: Diagnosis not present

## 2020-11-27 DIAGNOSIS — N189 Chronic kidney disease, unspecified: Secondary | ICD-10-CM | POA: Diagnosis not present

## 2020-11-28 ENCOUNTER — Other Ambulatory Visit: Payer: Self-pay | Admitting: Internal Medicine

## 2020-11-28 ENCOUNTER — Other Ambulatory Visit (HOSPITAL_COMMUNITY): Payer: Self-pay | Admitting: Internal Medicine

## 2020-11-28 DIAGNOSIS — R768 Other specified abnormal immunological findings in serum: Secondary | ICD-10-CM | POA: Diagnosis not present

## 2020-11-28 DIAGNOSIS — R319 Hematuria, unspecified: Secondary | ICD-10-CM | POA: Diagnosis not present

## 2020-11-28 DIAGNOSIS — R0602 Shortness of breath: Secondary | ICD-10-CM | POA: Diagnosis not present

## 2020-11-28 DIAGNOSIS — J984 Other disorders of lung: Secondary | ICD-10-CM | POA: Diagnosis not present

## 2020-11-28 DIAGNOSIS — H209 Unspecified iridocyclitis: Secondary | ICD-10-CM | POA: Diagnosis not present

## 2020-11-28 DIAGNOSIS — R918 Other nonspecific abnormal finding of lung field: Secondary | ICD-10-CM | POA: Diagnosis not present

## 2020-11-28 DIAGNOSIS — Z6825 Body mass index (BMI) 25.0-25.9, adult: Secondary | ICD-10-CM | POA: Diagnosis not present

## 2020-11-28 DIAGNOSIS — M792 Neuralgia and neuritis, unspecified: Secondary | ICD-10-CM | POA: Diagnosis not present

## 2020-11-28 DIAGNOSIS — J90 Pleural effusion, not elsewhere classified: Secondary | ICD-10-CM | POA: Diagnosis not present

## 2020-11-28 DIAGNOSIS — N179 Acute kidney failure, unspecified: Secondary | ICD-10-CM | POA: Diagnosis not present

## 2020-11-28 DIAGNOSIS — N059 Unspecified nephritic syndrome with unspecified morphologic changes: Secondary | ICD-10-CM

## 2020-11-28 DIAGNOSIS — R760 Raised antibody titer: Secondary | ICD-10-CM | POA: Diagnosis not present

## 2020-11-28 DIAGNOSIS — R5383 Other fatigue: Secondary | ICD-10-CM | POA: Diagnosis not present

## 2020-11-28 DIAGNOSIS — R634 Abnormal weight loss: Secondary | ICD-10-CM | POA: Diagnosis not present

## 2020-11-29 DIAGNOSIS — R918 Other nonspecific abnormal finding of lung field: Secondary | ICD-10-CM | POA: Diagnosis not present

## 2020-11-29 DIAGNOSIS — I776 Arteritis, unspecified: Secondary | ICD-10-CM | POA: Diagnosis not present

## 2020-11-29 DIAGNOSIS — N179 Acute kidney failure, unspecified: Secondary | ICD-10-CM | POA: Diagnosis not present

## 2020-11-29 DIAGNOSIS — Z20822 Contact with and (suspected) exposure to covid-19: Secondary | ICD-10-CM | POA: Diagnosis not present

## 2020-11-29 DIAGNOSIS — H209 Unspecified iridocyclitis: Secondary | ICD-10-CM | POA: Diagnosis not present

## 2020-11-29 DIAGNOSIS — Z7952 Long term (current) use of systemic steroids: Secondary | ICD-10-CM | POA: Diagnosis not present

## 2020-12-01 ENCOUNTER — Inpatient Hospital Stay (HOSPITAL_COMMUNITY)
Admission: EM | Admit: 2020-12-01 | Discharge: 2020-12-03 | DRG: 300 | Disposition: A | Discharge status: Home / Self Care | Payer: Medicare HMO | Attending: Family Medicine | Admitting: Family Medicine

## 2020-12-01 ENCOUNTER — Other Ambulatory Visit: Payer: Self-pay

## 2020-12-01 ENCOUNTER — Encounter (HOSPITAL_COMMUNITY): Payer: Self-pay

## 2020-12-01 ENCOUNTER — Emergency Department (HOSPITAL_COMMUNITY): Admission: EM | Admit: 2020-12-01 | Payer: Medicare HMO | Source: Home / Self Care

## 2020-12-01 DIAGNOSIS — I776 Arteritis, unspecified: Secondary | ICD-10-CM

## 2020-12-01 DIAGNOSIS — D6489 Other specified anemias: Secondary | ICD-10-CM | POA: Diagnosis not present

## 2020-12-01 DIAGNOSIS — Z20822 Contact with and (suspected) exposure to covid-19: Secondary | ICD-10-CM | POA: Diagnosis present

## 2020-12-01 DIAGNOSIS — Z79899 Other long term (current) drug therapy: Secondary | ICD-10-CM | POA: Diagnosis not present

## 2020-12-01 DIAGNOSIS — R310 Gross hematuria: Secondary | ICD-10-CM | POA: Diagnosis not present

## 2020-12-01 DIAGNOSIS — E8779 Other fluid overload: Secondary | ICD-10-CM | POA: Diagnosis not present

## 2020-12-01 DIAGNOSIS — I7782 Antineutrophilic cytoplasmic antibody (ANCA) vasculitis: Secondary | ICD-10-CM

## 2020-12-01 DIAGNOSIS — H109 Unspecified conjunctivitis: Secondary | ICD-10-CM | POA: Diagnosis not present

## 2020-12-01 DIAGNOSIS — N179 Acute kidney failure, unspecified: Secondary | ICD-10-CM | POA: Diagnosis present

## 2020-12-01 DIAGNOSIS — I7789 Other specified disorders of arteries and arterioles: Secondary | ICD-10-CM | POA: Diagnosis not present

## 2020-12-01 DIAGNOSIS — R768 Other specified abnormal immunological findings in serum: Secondary | ICD-10-CM

## 2020-12-01 DIAGNOSIS — H9190 Unspecified hearing loss, unspecified ear: Secondary | ICD-10-CM | POA: Diagnosis not present

## 2020-12-01 DIAGNOSIS — I10 Essential (primary) hypertension: Secondary | ICD-10-CM | POA: Diagnosis present

## 2020-12-01 DIAGNOSIS — J189 Pneumonia, unspecified organism: Secondary | ICD-10-CM | POA: Diagnosis not present

## 2020-12-01 HISTORY — DX: Glomerular disorders in diseases classified elsewhere: N08

## 2020-12-01 HISTORY — DX: Essential (primary) hypertension: I10

## 2020-12-01 LAB — URINALYSIS, ROUTINE W REFLEX MICROSCOPIC
Bilirubin Urine: NEGATIVE
Glucose, UA: NEGATIVE mg/dL
Ketones, ur: NEGATIVE mg/dL
Leukocytes,Ua: NEGATIVE
Nitrite: NEGATIVE
Protein, ur: 30 mg/dL — AB
Specific Gravity, Urine: 1.01 (ref 1.005–1.030)
pH: 5 (ref 5.0–8.0)

## 2020-12-01 LAB — CBC WITH DIFFERENTIAL/PLATELET
Abs Immature Granulocytes: 0.07 10*3/uL (ref 0.00–0.07)
Basophils Absolute: 0 10*3/uL (ref 0.0–0.1)
Basophils Relative: 0 %
Eosinophils Absolute: 0 10*3/uL (ref 0.0–0.5)
Eosinophils Relative: 0 %
HCT: 31.6 % — ABNORMAL LOW (ref 39.0–52.0)
Hemoglobin: 9.6 g/dL — ABNORMAL LOW (ref 13.0–17.0)
Immature Granulocytes: 1 %
Lymphocytes Relative: 7 %
Lymphs Abs: 0.8 10*3/uL (ref 0.7–4.0)
MCH: 26.7 pg (ref 26.0–34.0)
MCHC: 30.4 g/dL (ref 30.0–36.0)
MCV: 87.8 fL (ref 80.0–100.0)
Monocytes Absolute: 0.6 10*3/uL (ref 0.1–1.0)
Monocytes Relative: 5 %
Neutro Abs: 9 10*3/uL — ABNORMAL HIGH (ref 1.7–7.7)
Neutrophils Relative %: 87 %
Platelets: 507 10*3/uL — ABNORMAL HIGH (ref 150–400)
RBC: 3.6 MIL/uL — ABNORMAL LOW (ref 4.22–5.81)
RDW: 15.7 % — ABNORMAL HIGH (ref 11.5–15.5)
WBC: 10.4 10*3/uL (ref 4.0–10.5)
nRBC: 0 % (ref 0.0–0.2)

## 2020-12-01 LAB — COMPREHENSIVE METABOLIC PANEL
ALT: 12 U/L (ref 0–44)
AST: 12 U/L — ABNORMAL LOW (ref 15–41)
Albumin: 3.1 g/dL — ABNORMAL LOW (ref 3.5–5.0)
Alkaline Phosphatase: 73 U/L (ref 38–126)
Anion gap: 12 (ref 5–15)
BUN: 37 mg/dL — ABNORMAL HIGH (ref 8–23)
CO2: 24 mmol/L (ref 22–32)
Calcium: 9.3 mg/dL (ref 8.9–10.3)
Chloride: 100 mmol/L (ref 98–111)
Creatinine, Ser: 2.66 mg/dL — ABNORMAL HIGH (ref 0.61–1.24)
GFR, Estimated: 26 mL/min — ABNORMAL LOW (ref >60)
Glucose, Bld: 163 mg/dL — ABNORMAL HIGH (ref 70–99)
Potassium: 4.5 mmol/L (ref 3.5–5.1)
Sodium: 136 mmol/L (ref 135–145)
Total Bilirubin: 0.5 mg/dL (ref 0.3–1.2)
Total Protein: 7.5 g/dL (ref 6.5–8.1)

## 2020-12-01 LAB — RESP PANEL BY RT-PCR (FLU A&B, COVID) ARPGX2
Influenza A by PCR: NEGATIVE
Influenza B by PCR: NEGATIVE
SARS Coronavirus 2 by RT PCR: NEGATIVE

## 2020-12-01 LAB — PROTIME-INR
INR: 1 (ref 0.8–1.2)
Prothrombin Time: 13.3 seconds (ref 11.4–15.2)

## 2020-12-01 MED ORDER — SODIUM CHLORIDE 0.9 % IV SOLN
500.0000 mg | Freq: Every day | INTRAVENOUS | Status: AC
  Administered 2020-12-01 – 2020-12-03 (×3): 500 mg via INTRAVENOUS
  Filled 2020-12-01 (×3): qty 4

## 2020-12-01 MED ORDER — LACTATED RINGERS IV SOLN: INTRAVENOUS | Status: DC

## 2020-12-01 MED ORDER — METHOCARBAMOL 1000 MG/10ML IJ SOLN: 500.0000 mg | Freq: Four times a day (QID) | INTRAVENOUS | Status: DC | PRN

## 2020-12-01 MED ORDER — ONDANSETRON HCL 4 MG/2ML IJ SOLN: 4.0000 mg | Freq: Four times a day (QID) | INTRAMUSCULAR | Status: DC | PRN

## 2020-12-01 MED ORDER — ONDANSETRON HCL 4 MG PO TABS: 4.0000 mg | ORAL_TABLET | Freq: Four times a day (QID) | ORAL | Status: DC | PRN

## 2020-12-01 MED ORDER — AMLODIPINE BESYLATE 5 MG PO TABS
5.0000 mg | ORAL_TABLET | Freq: Every day | ORAL | Status: DC
  Administered 2020-12-02 – 2020-12-03 (×2): 5 mg via ORAL
  Filled 2020-12-01 (×2): qty 1

## 2020-12-01 MED ORDER — ACETAMINOPHEN 325 MG PO TABS: 650.0000 mg | ORAL_TABLET | Freq: Four times a day (QID) | ORAL | Status: DC | PRN

## 2020-12-01 MED ORDER — DOCUSATE SODIUM 100 MG PO CAPS
100.0000 mg | ORAL_CAPSULE | Freq: Two times a day (BID) | ORAL | Status: DC
  Administered 2020-12-02 – 2020-12-03 (×3): 100 mg via ORAL
  Filled 2020-12-01 (×3): qty 1

## 2020-12-01 MED ORDER — ACETAMINOPHEN 650 MG RE SUPP: 650.0000 mg | Freq: Four times a day (QID) | RECTAL | Status: DC | PRN

## 2020-12-01 MED ORDER — BISACODYL 5 MG PO TBEC: 5.0000 mg | DELAYED_RELEASE_TABLET | Freq: Every day | ORAL | Status: DC | PRN

## 2020-12-01 MED ORDER — HYDRALAZINE HCL 20 MG/ML IJ SOLN: 5.0000 mg | INTRAMUSCULAR | Status: DC | PRN

## 2020-12-01 MED ORDER — METHOCARBAMOL 500 MG PO TABS
500.0000 mg | ORAL_TABLET | Freq: Four times a day (QID) | ORAL | Status: DC | PRN
  Administered 2020-12-02: 500 mg via ORAL
  Filled 2020-12-01: qty 1

## 2020-12-01 MED ORDER — HYDROCODONE-ACETAMINOPHEN 5-325 MG PO TABS
1.0000 | ORAL_TABLET | ORAL | Status: DC | PRN
  Administered 2020-12-02: 1 via ORAL
  Administered 2020-12-02: 2 via ORAL
  Filled 2020-12-01: qty 1
  Filled 2020-12-01: qty 2

## 2020-12-01 MED ORDER — POLYETHYLENE GLYCOL 3350 17 G PO PACK: 17.0000 g | PACK | Freq: Every day | ORAL | Status: DC | PRN

## 2020-12-01 NOTE — H&P (Signed)
History and Physical    Sergio Sims ESP:[id number] DOB: Oct 27, 1955 DOA: 12/01/2020  PCP: Merryl Hacker, No Consultants:  Dan Europe - pulmonology; Benson Norway - GI Patient coming from:  Home - lives with wife; NOK: Wife, [phone]  Chief Complaint: renal biopsy  HPI: Sergio Sims is a 65 y.o. male with medical history significant of HTN and recent admission for AKI.  He was previously healthy but has had 2 months of weakness, DOE, exercise intolerance, and weight loss following an episode of pulmonary infection after multiple nodules were identified.   He was hospitalized from 7/29-31 for AKI and followed up with Dr. Jonnie Finner in clinic on 7/31.  Serologies have since returned concerning for ANCA vasculitis and Dr. Jonnie Finner has requested medicine admission for renal biopsy tomorrow.  The patient reports ongoing symptoms which improved while on prednisone and immediately restarted upon completion of short burst.  He is having weight loss; R upper back pain; visual disturbance; generalized weakness; confusion; and hearing loss.  He was told to come in today for admission and is dismayed that he is sitting in the ER without a clear plan.    ED Course: Concern for ANCA vasculitis with multi-organ involvement.  Needs semi-urgent renal biopsy, needs admission.  Dr. Jonnie Finner recommended labs and Solumedrol x 3 days and will see the patient.  Review of Systems: As per HPI; otherwise review of systems reviewed and negative.   Ambulatory Status:  Ambulates without assistance    Past Medical History:  Diagnosis Date   Hypertension    Renal disorder associated with systemic disease     History reviewed. No pertinent surgical history.  Social History   Socioeconomic History   Marital status: Married    Spouse name: Not on file   Number of children: Not on file   Years of education: Not on file   Highest education level: Not on file  Occupational History   Not on file  Tobacco Use   Smoking status: Never    Smokeless tobacco: Never  Substance and Sexual Activity   Alcohol use: Yes   Drug use: Not Currently   Sexual activity: Not on file  Other Topics Concern   Not on file  Social History Narrative   Not on file   Social Determinants of Health   Financial Resource Strain: Not on file  Food Insecurity: Not on file  Transportation Needs: Not on file  Physical Activity: Not on file  Stress: Not on file  Social Connections: Not on file  Intimate Partner Violence: Not on file    Not on File  History reviewed. No pertinent family history.  Prior to Admission medications   Medication Sig Start Date End Date Taking? Authorizing Provider  acetaminophen (TYLENOL) 500 MG tablet Take 500 mg by mouth every 6 (six) hours as needed for mild pain.    [provider]  amLODipine (NORVASC) 5 MG tablet Take 1 tablet (5 mg total) by mouth daily. 11/17/20   [name], MD  ferrous sulfate 325 (65 FE) MG tablet Take 1 tablet (325 mg total) by mouth daily. 11/17/20 11/17/21  [name], MD    Physical Exam: Vitals:   12/01/20 0838 12/01/20 1107 12/01/20 1354  BP: (!) 155/88 (!) 167/82 (!) 160/94  Pulse: 77 63 76  Resp: 16 18 17   Temp: 98 F (36.7 C)  97.9 F (36.6 C)  TempSrc:   Oral  SpO2: 100% 100% 100%     General:  Appears calm and comfortable and is in  NAD Eyes:  EOMI, normal lids, mildly conjunctival injection ENT:  grossly normal hearing, lips & tongue, mmm; appropriate dentition Neck:  no LAD, masses or thyromegaly Cardiovascular:  RRR, no m/r/g. No LE edema.  Respiratory:   CTA bilaterally with no wheezes/rales/rhonchi.  Normal respiratory effort. Abdomen:  soft, NT, ND Skin:  no rash or induration seen on limited exam Musculoskeletal:  grossly normal tone BUE/BLE, good ROM, no bony abnormality Psychiatric:  grossly normal mood and affect, speech fluent and appropriate, AOx3 Neurologic:  CN 2-12 grossly intact, moves all extremities in coordinated  fashion    Radiological Exams on Admission: Independently reviewed - see discussion in A/P where applicable  No results found.  EKG: not done   Labs on Admission: I have personally reviewed the available labs and imaging studies at the time of the admission.  Pertinent labs:   Glucose 163 BUN 37/Creatinine 2.66/GFR 26; 27/2.05/35 on 7/31 WBC 10.4 Hgb 9.6; 8.9 on 7/31 Platelets 507 C-ANCA 1:320 Anti-PR3 Ab >8 UA: 30 protein, large Hgb   Assessment/Plan Active Problems:   ANCA-associated vasculitis (HCC)   -Patient with recent issues with pulmonary nodules, weight loss, fatigue, vision/hearing disturbance -He was recently admitted for AKI in the setting of known HTN (on ACE) -Nephrology consulted and ordered tests which subsequently appeared to show C-ANCA likely causing vasculitis -He appears to be steroid-responsive, as his symptoms wax and wane based on prednisone dosing - and his eye symptoms improved with Pred Forte, as well -Nephrology has attempted to arrange outpatient renal biopsy but due to a long delay in getting this done as an outpatient he was recommended for expedited biopsy via admission -He will also need Solumedrol high-dose x 3 days -Conjunctivitis likely will also improve with steroids -Nephrology will consult -Will continue Norvasc for HTN for now     Note: This patient has been tested and is negative for the novel coronavirus COVID-19. The patient has been fully vaccinated against COVID-19.   Level of care: Med-Surg DVT prophylaxis: SCDs Code Status:  Full - confirmed with patient/family Family Communication: Wife was present throughout evaluation Disposition Plan:  The patient is from: home  Anticipated d/c is to: home without [facility] services   Anticipated d/c date will depend on clinical response to treatment, likely sometime after completion of 3 days of steroids  Patient is currently: acutely ill Consults called: Nephrology; IR  Admission  status:  Admit - It is my clinical opinion that admission to INPATIENT is reasonable and necessary because of the expectation that this patient will require hospital care that crosses at least 2 midnights to treat this condition based on the medical complexity of the problems presented.  Given the aforementioned information, the predictability of an adverse outcome is felt to be significant.    Karmen Bongo MD [facility]   How to contact the [facility] Attending or Consulting provider [place] or covering provider during after hours [place], for this patient?  Check the care team in [facility] and look for a) attending/consulting [facility] provider listed and b) the [facility] team listed Log into [email or web address] and use [place]'s universal password to access. If you do not have the password, please contact the hospital operator. Locate the [facility] provider you are looking for under [facility] and page to a number that you can be directly reached. If you still have difficulty reaching the provider, please page the [place] (Director on Call) for the Hospitalists listed on amion for assistance.   12/01/2020, 5:26  PM

## 2020-12-01 NOTE — ED Provider Notes (Signed)
[facility] EMERGENCY DEPARTMENT Provider Note   CSN: [id number] Arrival date & time: 12/01/20  0830     History CC - Kidney injury  Sergio Sims is a 65 y.o. male.  HPI  65 year old male that is being actively evaluated for an ANCA associated vasculitis with multiorgan involvement presenting to the emergency department due to concern for worsening renal function.  Patient reports that he was initially diagnosed with an acute kidney injury.  His ANCA titers were elevated, but he was not seen until his labs returned after his discharge.  Since that time, he has also been seen by pulmonology and by ophthalmology for exertional dyspnea and bilateral iritis.  He has intermittently been on steroids since his discharge.  He is being arranged for a kidney biopsy in order to obtain a definitive diagnosis, but at [facility] health this cannot be arranged till 8/22.  However, the patient is having worsening renal function and so he was told to present to [facility] for admission and a biopsy tomorrow.  Patient states that he resumed prednisone yesterday, taking 40 mg twice daily including earlier this morning.  He denies any current chest pain or shortness of breath, but states that if he walked down the hall he would likely become short of breath.  He denies any eye pain or vision changes currently.  He states that his urine seems more frothy, but he is still making good urine and has not noticed any blood in it.  Past Medical History:  Diagnosis Date   Renal disorder     Patient Active Problem List   Diagnosis Date Noted   AKI (acute kidney injury) ([place]) 11/15/2020    History reviewed. No pertinent surgical history.     No family history on file.     Home Medications Prior to Admission medications   Medication Sig Start Date End Date Taking? Authorizing Provider  acetaminophen (TYLENOL) 500 MG tablet Take 500 mg by mouth every 6 (six) hours as needed for mild  pain.    [provider]  amLODipine (NORVASC) 5 MG tablet Take 1 tablet (5 mg total) by mouth daily. 11/17/20   [name], MD  ferrous sulfate 325 (65 FE) MG tablet Take 1 tablet (325 mg total) by mouth daily. 11/17/20 11/17/21  [name], MD    Allergies    Patient has no allergy information on record.  Review of Systems   Review of Systems  Constitutional:  Negative for chills and fever.  HENT:  Negative for ear pain and sore throat.   Eyes:  Negative for pain and visual disturbance.  Respiratory:  Negative for cough and shortness of breath.   Cardiovascular:  Negative for chest pain and palpitations.  Gastrointestinal:  Negative for abdominal pain and vomiting.  Genitourinary:  Positive for urgency. Negative for decreased urine volume, dysuria, flank pain, frequency and hematuria.  Musculoskeletal:  Negative for arthralgias and back pain.  Skin:  Negative for color change and rash.  Neurological:  Negative for seizures and syncope.  All other systems reviewed and are negative.  Physical Exam Updated Vital Signs BP (!) 160/94 (BP Location: Left Arm)   Pulse 76   Temp 97.9 F (36.6 C) (Oral)   Resp 17   SpO2 100%   Physical Exam Vitals and nursing note reviewed.  Constitutional:      General: He is not in acute distress.    Appearance: He is well-developed. He is not ill-appearing or toxic-appearing.  HENT:     Head: Normocephalic and atraumatic.     Nose: Nose normal. No congestion or rhinorrhea.  Eyes:     Comments: Mildly injected sclera bilaterally  Cardiovascular:     Rate and Rhythm: Normal rate and regular rhythm.     Heart sounds: No murmur heard. Pulmonary:     Effort: Pulmonary effort is normal. No respiratory distress.     Breath sounds: Normal breath sounds.  Abdominal:     Palpations: Abdomen is soft.     Tenderness: There is no abdominal tenderness. There is no guarding or rebound.  Musculoskeletal:        General: Normal range of  motion.     Cervical back: Neck supple.  Skin:    General: Skin is warm and dry.     Capillary Refill: Capillary refill takes less than 2 seconds.  Neurological:     Mental Status: He is alert and oriented to person, place, and time.   ED Results / Procedures / Treatments   Labs (all labs ordered are listed, but only abnormal results are displayed) Labs Reviewed  COMPREHENSIVE METABOLIC PANEL - Abnormal; Notable for the following components:      Result Value   Glucose, Bld 163 (*)    BUN 37 (*)    Creatinine, Ser 2.66 (*)    Albumin 3.1 (*)    AST 12 (*)    GFR, Estimated 26 (*)    All other components within normal limits  URINALYSIS, ROUTINE W REFLEX MICROSCOPIC - Abnormal; Notable for the following components:   APPearance HAZY (*)    Hgb urine dipstick LARGE (*)    Protein, ur 30 (*)    Bacteria, UA RARE (*)    All other components within normal limits  CBC WITH DIFFERENTIAL/PLATELET - Abnormal; Notable for the following components:   RBC 3.60 (*)    Hemoglobin 9.6 (*)    HCT 31.6 (*)    RDW 15.7 (*)    Platelets 507 (*)    Neutro Abs 9.0 (*)    All other components within normal limits  RESP PANEL BY RT-PCR (FLU A&B, COVID) ARPGX2  PROTIME-INR    EKG None  Radiology No results found.  Procedures Procedures   Medications Ordered in ED Medications  methylPREDNISolone sodium succinate (SOLU-MEDROL) 500 mg in sodium chloride 0.9 % 50 mL IVPB (has no administration in time range)    ED Course  I have reviewed the triage vital signs and the nursing notes.  Pertinent labs & imaging results that were available during my care of the patient were reviewed by me and considered in my medical decision making (see chart for details).    MDM Rules/Calculators/A&P                           65 yo M with ANCA + labs presenting for admission to facility in order to facilitate a biopsy. Patient is currently asymptomatic while at rest. Vital signs reviewed, he is  mildly hypertensive.  Physical exam notable for mild scleral injection, but otherwise well-appearing male.  Labs reviewed from triage.  No significant leukocytosis, creatinine of 2.66 that is worse from previous values.  Elevated BUN of 67.  I spoke with the on-call nephrologist Dr. Jonnie Finner.  He recommended admission to the hospital to expedite biopsy and 500 mg of IV Solu-Medrol daily for the next 3 days, which I have ordered.  We will consult the hospitalist  for admission.  N.p.o. at midnight ordered.  Final Clinical Impression(s) / ED Diagnoses Final diagnoses:  ANCA-associated vasculitis [facility])    Rx / DC Orders ED Discharge Orders     None        Claud Kelp, MD 12/01/20 1620    Dorie Rank, MD 12/01/20 2128

## 2020-12-01 NOTE — ED Triage Notes (Signed)
Patient here for reported kidney biopsy. Patient states he has new kidney failure and was sent for prepping for biopsy through the ED. Patient alert and oriented in NAD

## 2020-12-01 NOTE — ED Provider Notes (Signed)
Emergency Medicine Provider Triage Evaluation Note  Sergio Sims , a 65 y.o. male  was evaluated in triage.  Pt states he is here for a kidney biopsy. He states he was sent by Dr. Jonnie Finner however cannot give me anymore information. Discussed case with Dr. Jonnie Finner who wants patient admitted to medicine service with plan for biopsy tomorrow. He would like CBC, CMP, INR, U/A, and 2 hour COVID testing.   Review of Systems  Positive: No complaints Negative: No complaints  Physical Exam  BP (!) 155/88 (BP Location: Right Arm)   Pulse 77   Temp 98 F (36.7 C)   Resp 16   SpO2 100%  Gen:   Awake, no distress   Resp:  Normal effort  MSK:   Moves extremities without difficulty  Other:    Medical Decision Making  Medically screening exam initiated at 8:53 AM.  Appropriate orders placed.  Benz Vandenberghe was informed that the remainder of the evaluation will be completed by another provider, this initial triage assessment does not replace that evaluation, and the importance of remaining in the ED until their evaluation is complete.  Lab work ordered. Dr. Jonnie Finner is aware pt will likely sit in the waiting room for several hours prior to being admitted to the hospitalist service.    Eustaquio Maize, PA-C 12/01/20 2417    Margette Fast, MD 12/01/20 [phone]

## 2020-12-01 NOTE — Progress Notes (Signed)
Patient with ANCA vasculitis sent to the ER so he can be admitted for treatment and kidney biopsy. Please contact nephrology on call, Dr. Jonnie Finner, who is aware of the patient.

## 2020-12-01 NOTE — Consult Note (Signed)
Renal Service Consult Note [facility]  Sergio Sims 12/01/2020 Sergio Blazing, MD Requesting Physician: Sergio. Lorin Sims  Reason for Consult: Renal failure HPI: The patient is a 65 y.o. year-old w/ hx of HTN, recent admit for AKI and found to have ANCA+ testing.  His creat was 2.3 upon dc 2 wks ago and was the same in his office visit w/ Sergio Sims at [facility] this past week.  Pt is admitted to get timely renal biopsy and IV solumedrol load 500mg  q day x 3 IV. As per HPI he had PNA about 2.5 mos ago and never recovered citing gen weakness, fatigue, exercise intolerance and wt loss, also numbness of the feet.    Pt seen in ED, no SOB, leg swelling or fevers.     ROS - denies CP, no joint pain, no HA, no blurry vision, no rash, no diarrhea, no nausea/ vomiting, no dysuria, no difficulty voiding   Past Medical History  Past Medical History:  Diagnosis Date   Hypertension    Renal disorder associated with systemic disease    Past Surgical History History reviewed. No pertinent surgical history. Family History History reviewed. No pertinent family history. Social History  reports that he has never smoked. He has never used smokeless tobacco. He reports current alcohol use. He reports that he does not currently use drugs. Allergies No Known Allergies Home medications Prior to Admission medications   Medication Sig Start Date End Date Taking? Authorizing Provider  acetaminophen (TYLENOL) 500 MG tablet Take 500 mg by mouth every 6 (six) hours as needed for mild pain.   Yes [provider]  amLODipine (NORVASC) 5 MG tablet Take 1 tablet (5 mg total) by mouth daily. 11/17/20  Yes Sergio Coss, MD  ferrous sulfate 325 (65 FE) MG tablet Take 1 tablet (325 mg total) by mouth daily. 11/17/20 11/17/21 Yes Sergio Coss, MD  predniSONE (DELTASONE) 20 MG tablet Take 40 mg by mouth 2 (two) times daily. 11/28/20  Yes [provider]     Vitals:   12/01/20 0838 12/01/20  1107 12/01/20 1354  BP: (!) 155/88 (!) 167/82 (!) 160/94  Pulse: 77 63 76  Resp: 16 18 17   Temp: 98 F (36.7 C)  97.9 F (36.6 C)  TempSrc:   Oral  SpO2: 100% 100% 100%   Exam Gen alert, no distress No rash, cyanosis or gangrene Sclera anicteric, throat clear  No jvd or bruits Chest clear bilat to bases, no rales/ wheezing RRR no MRG Abd soft ntnd no mass or ascites +bs GU normal MS no joint effusions or deformity Ext no LE or UE edema, no wounds or ulcers Neuro is alert, Ox 3 , nf     11/16/20 >  +C-ANCA at 1:320, and anti PR3 Ab's at > 8.0  Other serologies were neg in July including GBM Ab, ASO, antidNA, ANA, and Hep B / C    Assessment/ Plan: Renal failure - w/ serologic testing + for ANCA vasculiltis. Admitted for renal biopsy per IR and for solumedrol IV 500 mg daily x 3 starting today.   HTN - use non ACEi/ ARB agents for now until GN issues are under better control Volumle - euvolemic on exam      Sergio Splinter  MD 12/01/2020, 7:42 PM  Recent Labs  Lab 12/01/20 0852  WBC 10.4  HGB 9.6*   Recent Labs  Lab 12/01/20 0852  K 4.5  BUN 37*  CREATININE 2.66*  CALCIUM 9.3

## 2020-12-02 ENCOUNTER — Inpatient Hospital Stay (HOSPITAL_COMMUNITY): Payer: Medicare HMO

## 2020-12-02 ENCOUNTER — Encounter (HOSPITAL_COMMUNITY): Payer: Self-pay | Admitting: Internal Medicine

## 2020-12-02 DIAGNOSIS — I10 Essential (primary) hypertension: Secondary | ICD-10-CM | POA: Diagnosis not present

## 2020-12-02 DIAGNOSIS — I776 Arteritis, unspecified: Secondary | ICD-10-CM | POA: Diagnosis not present

## 2020-12-02 DIAGNOSIS — N179 Acute kidney failure, unspecified: Secondary | ICD-10-CM | POA: Diagnosis not present

## 2020-12-02 DIAGNOSIS — E8779 Other fluid overload: Secondary | ICD-10-CM | POA: Diagnosis not present

## 2020-12-02 HISTORY — PX: RENAL BIOPSY, PERCUTANEOUS: SUR144

## 2020-12-02 LAB — CBC
HCT: 28.9 % — ABNORMAL LOW (ref 39.0–52.0)
HCT: 28.6 % — ABNORMAL LOW (ref 39.0–52.0)
Hemoglobin: 9.1 g/dL — ABNORMAL LOW (ref 13.0–17.0)
Hemoglobin: 8.7 g/dL — ABNORMAL LOW (ref 13.0–17.0)
MCH: 27.3 pg (ref 26.0–34.0)
MCH: 26.4 pg (ref 26.0–34.0)
MCHC: 31.5 g/dL (ref 30.0–36.0)
MCHC: 30.4 g/dL (ref 30.0–36.0)
MCV: 86.8 fL (ref 80.0–100.0)
MCV: 86.9 fL (ref 80.0–100.0)
Platelets: 423 10*3/uL — ABNORMAL HIGH (ref 150–400)
Platelets: 410 10*3/uL — ABNORMAL HIGH (ref 150–400)
RBC: 3.33 MIL/uL — ABNORMAL LOW (ref 4.22–5.81)
RBC: 3.29 MIL/uL — ABNORMAL LOW (ref 4.22–5.81)
RDW: 15.8 % — ABNORMAL HIGH (ref 11.5–15.5)
RDW: 15.8 % — ABNORMAL HIGH (ref 11.5–15.5)
WBC: 6.4 10*3/uL (ref 4.0–10.5)
WBC: 10.9 10*3/uL — ABNORMAL HIGH (ref 4.0–10.5)
nRBC: 0 % (ref 0.0–0.2)
nRBC: 0 % (ref 0.0–0.2)

## 2020-12-02 LAB — BASIC METABOLIC PANEL
Anion gap: 9 (ref 5–15)
BUN: 45 mg/dL — ABNORMAL HIGH (ref 8–23)
CO2: 22 mmol/L (ref 22–32)
Calcium: 8.7 mg/dL — ABNORMAL LOW (ref 8.9–10.3)
Chloride: 106 mmol/L (ref 98–111)
Creatinine, Ser: 2.5 mg/dL — ABNORMAL HIGH (ref 0.61–1.24)
GFR, Estimated: 27 mL/min — ABNORMAL LOW (ref >60)
Glucose, Bld: 152 mg/dL — ABNORMAL HIGH (ref 70–99)
Potassium: 4.7 mmol/L (ref 3.5–5.1)
Sodium: 137 mmol/L (ref 135–145)

## 2020-12-02 MED ORDER — MIDAZOLAM HCL 2 MG/2ML IJ SOLN
INTRAMUSCULAR | Status: AC | PRN
  Administered 2020-12-02 (×2): 1 mg via INTRAVENOUS

## 2020-12-02 MED ORDER — MIDAZOLAM HCL 2 MG/2ML IJ SOLN
INTRAMUSCULAR | Status: AC
  Filled 2020-12-02: qty 2

## 2020-12-02 MED ORDER — FENTANYL CITRATE (PF) 100 MCG/2ML IJ SOLN
INTRAMUSCULAR | Status: AC | PRN
  Administered 2020-12-02: 50 ug via INTRAVENOUS

## 2020-12-02 MED ORDER — LIDOCAINE HCL (PF) 1 % IJ SOLN
INTRAMUSCULAR | Status: AC
  Filled 2020-12-02: qty 30

## 2020-12-02 MED ORDER — GELATIN ABSORBABLE 12-7 MM EX MISC
CUTANEOUS | Status: AC
  Filled 2020-12-02: qty 1

## 2020-12-02 MED ORDER — FENTANYL CITRATE (PF) 100 MCG/2ML IJ SOLN
INTRAMUSCULAR | Status: AC
  Filled 2020-12-02: qty 2

## 2020-12-02 NOTE — H&P (Signed)
Chief Complaint: Patient was seen in consultation today for ARF, random renal bx  Referring Physician(s): Dr. Jonnie Finner  Supervising Physician: Jacqulynn Cadet  Patient Status: [facility] - [facility]  History of Present Illness: Sergio Sims is a 65 y.o. male   w/ hx of HTN, AKI, and PNA.  Found to have ANCA+ test 2 months weakness, dyspnea, and weight loss following episode of pulmonary infection  He was referred by Dr. Jonnie Finner for renal biopsy.  Pt was seen in the ED awaiting a room for admission.      Past Medical History:  Diagnosis Date   Hypertension    Renal disorder associated with systemic disease     History reviewed. No pertinent surgical history.  Allergies: Patient has no known allergies.  Medications: Prior to Admission medications   Medication Sig Start Date End Date Taking? Authorizing Provider  acetaminophen (TYLENOL) 500 MG tablet Take 500 mg by mouth every 6 (six) hours as needed for mild pain.   Yes [provider]  amLODipine (NORVASC) 5 MG tablet Take 1 tablet (5 mg total) by mouth daily. 11/17/20  Yes [name], MD  ferrous sulfate 325 (65 FE) MG tablet Take 1 tablet (325 mg total) by mouth daily. 11/17/20 11/17/21 Yes [name], MD  predniSONE (DELTASONE) 20 MG tablet Take 40 mg by mouth 2 (two) times daily. 11/28/20  Yes [provider]     History reviewed. No pertinent family history.  Social History   Socioeconomic History   Marital status: Married    Spouse name: Not on file   Number of children: Not on file   Years of education: Not on file   Highest education level: Not on file  Occupational History   Occupation: hvac  Tobacco Use   Smoking status: Never   Smokeless tobacco: Never  Substance and Sexual Activity   Alcohol use: Yes   Drug use: Not Currently   Sexual activity: Not on file  Other Topics Concern   Not on file  Social History Narrative   Not on file   Social Determinants of Health    Financial Resource Strain: Not on file  Food Insecurity: Not on file  Transportation Needs: Not on file  Physical Activity: Not on file  Stress: Not on file  Social Connections: Not on file       Review of Systems  Constitutional:  Negative for chills and fever.  Respiratory:  Positive for shortness of breath.        Pt reports intermittent SOB from recent PNA  Cardiovascular:  Negative for chest pain.  Gastrointestinal:  Negative for abdominal pain, nausea and vomiting.   Vital Signs: BP (!) 166/81 (BP Location: Left Arm)   Pulse 71   Temp 97.8 F (36.6 C) (Oral)   Resp 16   SpO2 98%   Physical Exam Constitutional:      Appearance: Normal appearance.  HENT:     Mouth/Throat:     Mouth: Mucous membranes are moist.     Pharynx: Oropharynx is clear.  Cardiovascular:     Rate and Rhythm: Normal rate and regular rhythm.     Heart sounds: Normal heart sounds.  Pulmonary:     Effort: Pulmonary effort is normal.     Breath sounds: Normal breath sounds. No wheezing, rhonchi or rales.  Abdominal:     General: Bowel sounds are normal.     Palpations: Abdomen is soft.     Tenderness: There is no abdominal tenderness. There  is no guarding.  Skin:    General: Skin is warm and dry.  Neurological:     Mental Status: He is alert and oriented to person, place, and time.  Psychiatric:        Mood and Affect: Mood normal.        Behavior: Behavior normal.        Thought Content: Thought content normal.        Judgment: Judgment normal.    Imaging: DG Chest 2 View  Result Date: 11/15/2020 CLINICAL DATA:  Recent infection EXAM: CHEST - 2 VIEW COMPARISON:  CT 09/27/2020 FINDINGS: Previously seen small nodules by CT are not well visualized by plain film and should be followed by CT. Heart is normal size. Probable scarring in the lung bases. Small right pleural effusion again suspected, unchanged. IMPRESSION: Bibasilar opacities, right greater than left, favor scarring. Small  right pleural effusion. Previously seen small pulmonary nodules not well visualized by plain films and can be followed with CT as clinically indicated. Electronically Signed   By: Rolm Baptise M.D.   On: 11/15/2020 19:07   US Renal  Result Date: 11/15/2020 CLINICAL DATA:  Renal failure. EXAM: RENAL / URINARY TRACT ULTRASOUND COMPLETE COMPARISON:  None. FINDINGS: Right Kidney: Renal measurements: 12.8 x 6.6 x 6.0 cm = volume: 262.7 mL. Echogenicity appears increased. Two simple cysts are seen measuring 1.0 and 2.3 cm. No solid mass or hydronephrosis visualized. Left Kidney: Renal measurements: 12.1 x 6.8 x 6.4 cm = volume: 276 mL. Echogenicity appears increased. 0.7 cm cyst noted. No solid mass or hydronephrosis visualized. Bladder: Appears normal for degree of bladder distention. Other: Prostate gland appears enlarged. IMPRESSION: Negative for hydronephrosis or acute abnormality. Increased cortical echogenicity compatible with medical renal disease. Electronically Signed   By: Inge Rise M.D.   On: 11/15/2020 18:54    Labs:  CBC: Recent Labs    11/16/20 0822 11/17/20 0224 12/01/20 0852 12/02/20 0500  WBC 6.1 6.7 10.4 6.4  HGB 9.4* 8.9* 9.6* 9.1*  HCT 30.1* 28.1* 31.6* 28.9*  PLT 317 339 507* 423*    COAGS: Recent Labs    12/01/20 0852  INR 1.0    BMP: Recent Labs    11/16/20 0204 11/17/20 0224 12/01/20 0852 12/02/20 0500  NA 135 136 136 137  K 4.4 4.2 4.5 4.7  CL 104 106 100 106  CO2 22 23 24 22   GLUCOSE 104* 97 163* 152*  BUN 29* 27* 37* 45*  CALCIUM 8.5* 8.4* 9.3 8.7*  CREATININE 2.31* 2.05* 2.66* 2.50*  GFRNONAA 30* 35* 26* 27*    LIVER FUNCTION TESTS: Recent Labs    12/01/20 0852  BILITOT 0.5  AST 12*  ALT 12  ALKPHOS 73  PROT 7.5  ALBUMIN 3.1*    TUMOR MARKERS: No results for input(s): AFPTM, CEA, CA199, CHROMGRNA in the last 8760 hours.  Assessment and Plan:  Hx of HTN, AKI, and PNA. He was referred by Dr. Jonnie Finner for renal biopsy. Pt was  seen in the ED awaiting a room for admission. Pt wife at bedside.   Risks and benefits of image guided random renal biopsy was discussed with the patient and/or patient's family including, but not limited to bleeding, infection, damage to adjacent structures or low yield requiring additional tests.  All of the questions were answered and there is agreement to proceed.  Consent signed and in chart.   Thank you for this interesting consult.  I greatly enjoyed meeting Sergio Sims and look  forward to participating in their care.  A copy of this report was sent to the requesting provider on this date.  Electronically Signed: Tyson Alias, NP 12/02/2020, 11:28 AM   I spent a total of 30 minutes face to face in clinical consultation, greater than 50% of which was counseling/coordinating care for image guided random renal biopsy.

## 2020-12-02 NOTE — ED Notes (Signed)
Patient being transported to IR

## 2020-12-02 NOTE — Procedures (Addendum)
Interventional Radiology Procedure Note  Date of Procedure: 12/02/2020  Procedure: US guided random renal biopsy   Findings:  1. Successful US guided random renal biopsy of the left kidney lower pole   2. Gelfoam slurry administered   Complications: No immediate complications noted.   Estimated Blood Loss: minimal  Follow-up and Recommendations: 1. Bedrest 6 hours post procedure  2. May have hematuria after procedure. This would be expected to improve or resolve after 24 hours  3. IR will round in the AM  4. Please obtain CBC call IR if any concern for ongoing bleeding    Albin Felling, MD  Vascular & Interventional Radiology  12/02/2020 3:18 PM

## 2020-12-02 NOTE — Progress Notes (Signed)
[place] [facility] PROGRESS NOTE    Sergio Sims  BPZ:[id number] DOB: 11-05-1955 DOA: 12/01/2020 PCP: Pcp, No      Brief Narrative:  Sergio Sims is a 65 y.o. M with HTN as well as recent renal insufficiency in the setting of ANCA+ serologies who presented for renal biopsy.  Patient developed several months of weakness, dyspnea on exertion, fatigue, and weight loss.  Hospitalized at the end of last month for AKI, after which ANCA serologies were positive.  Nephrology requested admission for renal biopsy.      Assessment & Plan:  Acute renal failure ANCA associated vasculitis Patient was admitted and nephrology were consulted, he was started on high-dose steroids.    Underwent renal biopsy this afternoon.  Afterward he had 1 episode of blood from the penis, about 50 to 100 cc, followed by an episode of urine with less blood.  No tachycardia.  Repeat hemoglobin stable.    -Consult nephrology - Continue steroids - Trend hemoglobin after biopsy   Hypertension Pressure controlled - Continue amlodipine  ANCA associated anemia -Trend hemoglobin after biopsy     Disposition: Status is: Inpatient  Remains inpatient appropriate because:IV treatments appropriate due to intensity of illness or inability to take PO  Dispo: The patient is from: Home              Anticipated d/c is to: Home              Patient currently is not medically stable to d/c.   Difficult to place patient No       Level of care: Med-Surg      Patient with new renal failure . Admitted for steroid induction and biopsy.    If no complications post-biopsy, and Hgb stable in AM, will discuss with Sergio Sims transitioning to oral prednisone and discharge with close renal follow up.       MDM: The below labs and imaging reports were reviewed and summarized above.  Medication management as above.       DVT prophylaxis: SCDs Start: 12/01/20 1700  Code Status: Full  code          Subjective: Patient had some gross hematuria after the procedure.  No fever.  Mild flank pain.  No fever, confusion, rash, cough, dyspnea.  Objective: Vitals:   12/02/20 1505 12/02/20 1510 12/02/20 1515 12/02/20 1708  BP: 140/60 135/62 138/68 (!) 148/75  Pulse: 80 80 79 69  Resp: 13 14 12 18   Temp:    98.4 F (36.9 C)  TempSrc:    Oral  SpO2: 100% 100% 100% 100%    Intake/Output Summary (Last 24 hours) at 12/02/2020 2105 Last data filed at 12/02/2020 1800 Gross per 24 hour  Intake 1138.24 ml  Output --  Net 1138.24 ml   There were no vitals filed for this visit.  Examination: General appearance:  adult male, alert and in no acute distress.   HEENT: Anicteric, conjunctiva pink, lids and lashes normal. No nasal deformity, discharge, epistaxis.  Lips moist.   Skin:  No suspicious rashes or lesions. Cardiac: RRR, nl S1-S2, no murmurs appreciated.  No edema Respiratory: Normal respiratory rate and rhythm.  CTAB without rales or wheezes. Abdomen: Abdomen soft.  no TTP.  No CVA tenderness. No ascites, distension, hepatosplenomegaly.   MSK: No deformities or effusions. Neuro: Awake and alert.  EOMI, moves all extremities. Speech fluent.    Psych: Sensorium intact and responding to questions, attention normal. Affect normal.  Judgment and  insight appear normal.    Data Reviewed: I have personally reviewed following labs and imaging studies:  CBC: Recent Labs  Lab 12/01/20 0852 12/02/20 0500 12/02/20 1757  WBC 10.4 6.4 10.9*  NEUTROABS 9.0*  --   --   HGB 9.6* 9.1* 8.7*  HCT 31.6* 28.9* 28.6*  MCV 87.8 86.8 86.9  PLT 507* 423* 413*   Basic Metabolic Panel: Recent Labs  Lab 12/01/20 0852 12/02/20 0500  NA 136 137  K 4.5 4.7  CL 100 106  CO2 24 22  GLUCOSE 163* 152*  BUN 37* 45*  CREATININE 2.66* 2.50*  CALCIUM 9.3 8.7*   GFR: CrCl cannot be calculated (Unknown ideal weight.). Liver Function Tests: Recent Labs  Lab 12/01/20 0852  AST  12*  ALT 12  ALKPHOS 73  BILITOT 0.5  PROT 7.5  ALBUMIN 3.1*   No results for input(s): LIPASE, AMYLASE in the last 168 hours. No results for input(s): AMMONIA in the last 168 hours. Coagulation Profile: Recent Labs  Lab 12/01/20 0852  INR 1.0   Cardiac Enzymes: No results for input(s): CKTOTAL, CKMB, CKMBINDEX, TROPONINI in the last 168 hours. BNP (last 3 results) No results for input(s): PROBNP in the last 8760 hours. HbA1C: No results for input(s): HGBA1C in the last 72 hours. CBG: No results for input(s): GLUCAP in the last 168 hours. Lipid Profile: No results for input(s): CHOL, HDL, LDLCALC, TRIG, CHOLHDL, LDLDIRECT in the last 72 hours. Thyroid Function Tests: No results for input(s): TSH, T4TOTAL, FREET4, T3FREE, THYROIDAB in the last 72 hours. Anemia Panel: No results for input(s): VITAMINB12, FOLATE, FERRITIN, TIBC, IRON, RETICCTPCT in the last 72 hours. Urine analysis:    Component Value Date/Time   COLORURINE YELLOW 12/01/2020 0852   APPEARANCEUR HAZY (A) 12/01/2020 0852   LABSPEC 1.010 12/01/2020 0852   PHURINE 5.0 12/01/2020 0852   GLUCOSEU NEGATIVE 12/01/2020 0852   HGBUR LARGE (A) 12/01/2020 0852   BILIRUBINUR NEGATIVE 12/01/2020 0852   KETONESUR NEGATIVE 12/01/2020 0852   PROTEINUR 30 (A) 12/01/2020 0852   NITRITE NEGATIVE 12/01/2020 0852   LEUKOCYTESUR NEGATIVE 12/01/2020 0852   Sepsis Labs: @LABRCNTIP (procalcitonin:4,lacticacidven:4)  ) Recent Results (from the past 240 hour(s))  Resp Panel by RT-PCR (Flu A&B, Covid) Nasopharyngeal Swab     Status: None   Collection Time: 12/01/20  5:06 PM   Specimen: Nasopharyngeal Swab; Nasopharyngeal(NP) swabs in vial transport medium  Result Value Ref Range Status   SARS Coronavirus 2 by RT PCR NEGATIVE NEGATIVE Final    Comment: (NOTE) SARS-CoV-2 target nucleic acids are NOT DETECTED.  The SARS-CoV-2 RNA is generally detectable in upper respiratory specimens during the acute phase of infection. The  lowest concentration of SARS-CoV-2 viral copies this assay can detect is 138 copies/mL. A negative result does not preclude SARS-Cov-2 infection and should not be used as the sole basis for treatment or other patient management decisions. A negative result may occur with  improper specimen collection/handling, submission of specimen other than nasopharyngeal swab, presence of viral mutation(s) within the areas targeted by this assay, and inadequate number of viral copies(<138 copies/mL). A negative result must be combined with clinical observations, patient history, and epidemiological information. The expected result is Negative.  Fact Sheet for Patients:  EntrepreneurPulse.com.au  Fact Sheet for Healthcare Providers:  IncredibleEmployment.be  This test is no t yet approved or cleared by the [place] FDA and  has been authorized for detection and/or diagnosis of SARS-CoV-2 by FDA under an Emergency Use Authorization (EUA). This EUA  will remain  in effect (meaning this test can be used) for the duration of the COVID-19 declaration under Section 564(b)(1) of the Act, 21 U.S.C.section 360bbb-3(b)(1), unless the authorization is terminated  or revoked sooner.       Influenza A by PCR NEGATIVE NEGATIVE Final   Influenza B by PCR NEGATIVE NEGATIVE Final    Comment: (NOTE) The Xpert Xpress SARS-CoV-2/FLU/RSV plus assay is intended as an aid in the diagnosis of influenza from Nasopharyngeal swab specimens and should not be used as a sole basis for treatment. Nasal washings and aspirates are unacceptable for Xpert Xpress SARS-CoV-2/FLU/RSV testing.  Fact Sheet for Patients: EntrepreneurPulse.com.au  Fact Sheet for Healthcare Providers: IncredibleEmployment.be  This test is not yet approved or cleared by the [place] FDA and has been authorized for detection and/or diagnosis of SARS-CoV-2 by FDA under  an Emergency Use Authorization (EUA). This EUA will remain in effect (meaning this test can be used) for the duration of the COVID-19 declaration under Section 564(b)(1) of the Act, 21 U.S.C. section 360bbb-3(b)(1), unless the authorization is terminated or revoked.  Performed at [facility], [place] [address], [place], [place] [address]          Radiology Studies: US BIOPSY (KIDNEY)  Result Date: 12/02/2020 INDICATION: Acute renal failure EXAM: Ultrasound-guided random renal biopsy MEDICATIONS: None. ANESTHESIA/SEDATION: Moderate (conscious) sedation was employed during this procedure. A total of Versed 2 mg and Fentanyl 50 mcg was administered intravenously. Moderate Sedation Time: 15 minutes. The patient's level of consciousness and vital signs were monitored continuously by radiology nursing throughout the procedure under my direct supervision. FLUOROSCOPY TIME:  N/a COMPLICATIONS: None immediate. PROCEDURE: Informed written consent was obtained from the patient after a thorough discussion of the procedural risks, benefits and alternatives. All questions were addressed. Maximal Sterile Barrier Technique was utilized including caps, mask, sterile gowns, sterile gloves, sterile drape, hand hygiene and skin antiseptic. A timeout was performed prior to the initiation of the procedure. The patient was placed prone on the exam table. Limited ultrasound of the bilateral flanks US performed for planning purposes. Appropriate biopsy location was selected of the left renal lower pole, and the overlying skin was prepped and draped in the standard sterile fashion. Local analgesia was obtained with 1% lidocaine. Using ultrasound guidance, a 15 gauge introducer needle was advanced just deep to the cortex of the left renal lower pole. Subsequently, core needle biopsy was performed using a 16 gauge core biopsy device x2 passes. Specimens were submitted in saline for further healing. Gel-Foam slurry was  administered through the introducer needle as it was removed. Limited postprocedure imaging demonstrated no surrounding hematoma. A clean dressing was placed. The patient tolerated the procedure well without immediate complication IMPRESSION: Successful ultrasound-guided random renal biopsy of the left renal lower pole. Electronically Signed   By: Albin Felling M.D.   On: 12/02/2020 16:01        Scheduled Meds:  amLODipine  5 mg Oral Daily   docusate sodium  100 mg Oral BID   fentaNYL       gelatin adsorbable       lidocaine (PF)       midazolam       Continuous Infusions:  lactated ringers 75 mL/hr at 12/02/20 1904   methylPREDNISolone (SOLU-MEDROL) injection Stopped (12/02/20 1038)     LOS: 1 day    Time spent: 25 minutes    Edwin Dada, MD [facility] 12/02/2020, 9:05 PM  Please page though [place] or Epic secure chat:  For [employer], Adult nurse

## 2020-12-02 NOTE — Progress Notes (Signed)
Patient ID: Sergio Sims, male   DOB: 1955-06-09, 65 y.o.   MRN: [id number] [facility] Progress Note   Assessment/ Plan:   1. Acute kidney Injury: Serological testing suggestive of ANCA vasculitis and admitted for steroid loading with Solu-Medrol and renal biopsy.  He does not have any evidence of hemoptysis/alveolar hemorrhage.  Plans in place for renal biopsy today and potentially discharge home tomorrow on oral prednisone with outpatient nephrology follow-up with [name]. 2.  Hypertension: Blood pressure elevated-in part situational from being in a hallway in the emergency room overnight.  Currently on treatment with amlodipine. 3.  Anemia: Likely secondary to acute illness/microscopic hematuria-monitor trend to decide on need for additional management.  Subjective:   Reports to be feeling fair and denies any shortness of breath at rest.  Still in hallway in the emergency room.   Objective:   BP (!) 166/81 (BP Location: Left Arm)   Pulse 71   Temp 97.8 F (36.6 C) (Oral)   Resp 16   SpO2 98%   Intake/Output Summary (Last 24 hours) at 12/02/2020 1154 Last data filed at 12/01/2020 1844 Gross per 24 hour  Intake 50 ml  Output --  Net 50 ml   Weight change:   Physical Exam: Gen: Resting comfortably in bed, wife at bedside CVS: Pulse regular rhythm, normal rate, S1 and S2 normal Resp: Clear to auscultation bilaterally, no rales/rhonchi Abd: Soft, flat, nontender, bowel sounds normal Ext: No lower extremity edema  Imaging: No results found.  Labs: BMET Recent Labs  Lab 12/01/20 0852 12/02/20 0500  NA 136 137  K 4.5 4.7  CL 100 106  CO2 24 22  GLUCOSE 163* 152*  BUN 37* 45*  CREATININE 2.66* 2.50*  CALCIUM 9.3 8.7*   CBC Recent Labs  Lab 12/01/20 0852 12/02/20 0500  WBC 10.4 6.4  NEUTROABS 9.0*  --   HGB 9.6* 9.1*  HCT 31.6* 28.9*  MCV 87.8 86.8  PLT 507* 423*   Medications:     amLODipine  5 mg Oral Daily   docusate sodium  100 mg Oral  BID   Elmarie Shiley, MD 12/02/2020, 11:54 AM

## 2020-12-02 NOTE — Progress Notes (Signed)
NEW ADMISSION NOTE New Admission Note:   Arrival Method: Stretcher  Mental Orientation: A&O x 4 Telemetry: n/a Assessment: Completed Skin: see flowsheet IV: L hand Pain: 5/10 Tubes: none Safety Measures: Safety Fall Prevention Plan has been given, discussed and signed Admission: Completed [facility] Orientation: Patient has been orientated to the room, unit and staff.  Family: none present  Orders have been reviewed and implemented. Will continue to monitor the patient. Call light has been placed within reach and bed alarm has been activated.   Mikki Santee, RN

## 2020-12-03 DIAGNOSIS — I10 Essential (primary) hypertension: Secondary | ICD-10-CM | POA: Diagnosis not present

## 2020-12-03 DIAGNOSIS — N179 Acute kidney failure, unspecified: Secondary | ICD-10-CM | POA: Diagnosis not present

## 2020-12-03 DIAGNOSIS — E8779 Other fluid overload: Secondary | ICD-10-CM | POA: Diagnosis not present

## 2020-12-03 DIAGNOSIS — R82998 Other abnormal findings in urine: Secondary | ICD-10-CM | POA: Diagnosis not present

## 2020-12-03 DIAGNOSIS — I776 Arteritis, unspecified: Secondary | ICD-10-CM | POA: Diagnosis not present

## 2020-12-03 DIAGNOSIS — Z9889 Other specified postprocedural states: Secondary | ICD-10-CM | POA: Diagnosis not present

## 2020-12-03 LAB — CBC
HCT: 25.2 % — ABNORMAL LOW (ref 39.0–52.0)
HCT: 26.4 % — ABNORMAL LOW (ref 39.0–52.0)
Hemoglobin: 8 g/dL — ABNORMAL LOW (ref 13.0–17.0)
Hemoglobin: 8.4 g/dL — ABNORMAL LOW (ref 13.0–17.0)
MCH: 27.3 pg (ref 26.0–34.0)
MCH: 27.5 pg (ref 26.0–34.0)
MCHC: 31.7 g/dL (ref 30.0–36.0)
MCHC: 31.8 g/dL (ref 30.0–36.0)
MCV: 86 fL (ref 80.0–100.0)
MCV: 86.6 fL (ref 80.0–100.0)
Platelets: 355 10*3/uL (ref 150–400)
Platelets: 377 10*3/uL (ref 150–400)
RBC: 2.93 MIL/uL — ABNORMAL LOW (ref 4.22–5.81)
RBC: 3.05 MIL/uL — ABNORMAL LOW (ref 4.22–5.81)
RDW: 15.9 % — ABNORMAL HIGH (ref 11.5–15.5)
RDW: 15.9 % — ABNORMAL HIGH (ref 11.5–15.5)
WBC: 15.2 10*3/uL — ABNORMAL HIGH (ref 4.0–10.5)
WBC: 14.1 10*3/uL — ABNORMAL HIGH (ref 4.0–10.5)
nRBC: 0 % (ref 0.0–0.2)
nRBC: 0 % (ref 0.0–0.2)

## 2020-12-03 LAB — BASIC METABOLIC PANEL
Anion gap: 9 (ref 5–15)
BUN: 56 mg/dL — ABNORMAL HIGH (ref 8–23)
CO2: 24 mmol/L (ref 22–32)
Calcium: 8.6 mg/dL — ABNORMAL LOW (ref 8.9–10.3)
Chloride: 104 mmol/L (ref 98–111)
Creatinine, Ser: 2.95 mg/dL — ABNORMAL HIGH (ref 0.61–1.24)
GFR, Estimated: 23 mL/min — ABNORMAL LOW (ref >60)
Glucose, Bld: 116 mg/dL — ABNORMAL HIGH (ref 70–99)
Potassium: 4.6 mmol/L (ref 3.5–5.1)
Sodium: 137 mmol/L (ref 135–145)

## 2020-12-03 MED ORDER — HYDROCODONE-ACETAMINOPHEN 5-325 MG PO TABS: 1.0000 | ORAL_TABLET | Freq: Four times a day (QID) | ORAL | 0 refills | Status: AC | PRN

## 2020-12-03 MED ORDER — PREDNISONE 50 MG PO TABS: 60.0000 mg | ORAL_TABLET | Freq: Every day | ORAL | Status: DC

## 2020-12-03 MED ORDER — PREDNISONE 20 MG PO TABS: 60.0000 mg | ORAL_TABLET | Freq: Every day | ORAL | 0 refills | Status: DC

## 2020-12-03 NOTE — Progress Notes (Signed)
Hemogram relatively stable, making urine with just a small amount of residual blood this morning, no clots.  Flank pain is improved.  Hemodynamics stable.  Possibly home today, defer dispo to Nephrology.

## 2020-12-03 NOTE — Progress Notes (Signed)
Referring Physician(s): Graylon Gunning  Supervising Physician: Mir, Sharen Heck  Patient Status:  [facility] - In-pt  Chief Complaint:  S/P random L renal bx  Subjective:  Pt found to be sitting up in bed Pt ate 100% of breakfast Pt reports pink tinged urine today He denies dysuria or retention  Reports 2/10 flank pain describes as discomfort   Allergies: Patient has no known allergies.  Medications: Prior to Admission medications   Medication Sig Start Date End Date Taking? Authorizing Provider  acetaminophen (TYLENOL) 500 MG tablet Take 500 mg by mouth every 6 (six) hours as needed for mild pain.   Yes [provider]  amLODipine (NORVASC) 5 MG tablet Take 1 tablet (5 mg total) by mouth daily. 11/17/20  Yes [name], MD  ferrous sulfate 325 (65 FE) MG tablet Take 1 tablet (325 mg total) by mouth daily. 11/17/20 11/17/21 Yes [name], MD  predniSONE (DELTASONE) 20 MG tablet Take 40 mg by mouth 2 (two) times daily. 11/28/20  Yes [provider]     Vital Signs: BP 134/73 (BP Location: Left Arm)   Pulse (!) 58   Temp (!) 97.4 F (36.3 C) (Oral)   Resp 18   SpO2 100%   Physical Exam Constitutional:      Appearance: Normal appearance.  Skin:    General: Skin is warm and dry.     Comments: L renal bx site is clean and dry No redness, bleeding or drainage noted  Neurological:     Mental Status: He is alert and oriented to person, place, and time.  Psychiatric:        Mood and Affect: Mood normal.        Behavior: Behavior normal.        Thought Content: Thought content normal.        Judgment: Judgment normal.    Imaging: US BIOPSY (KIDNEY)  Result Date: 12/02/2020 INDICATION: Acute renal failure EXAM: Ultrasound-guided random renal biopsy MEDICATIONS: None. ANESTHESIA/SEDATION: Moderate (conscious) sedation was employed during this procedure. A total of Versed 2 mg and Fentanyl 50 mcg was administered intravenously. Moderate Sedation Time: 15  minutes. The patient's level of consciousness and vital signs were monitored continuously by radiology nursing throughout the procedure under my direct supervision. FLUOROSCOPY TIME:  N/a COMPLICATIONS: None immediate. PROCEDURE: Informed written consent was obtained from the patient after a thorough discussion of the procedural risks, benefits and alternatives. All questions were addressed. Maximal Sterile Barrier Technique was utilized including caps, mask, sterile gowns, sterile gloves, sterile drape, hand hygiene and skin antiseptic. A timeout was performed prior to the initiation of the procedure. The patient was placed prone on the exam table. Limited ultrasound of the bilateral flanks US performed for planning purposes. Appropriate biopsy location was selected of the left renal lower pole, and the overlying skin was prepped and draped in the standard sterile fashion. Local analgesia was obtained with 1% lidocaine. Using ultrasound guidance, a 15 gauge introducer needle was advanced just deep to the cortex of the left renal lower pole. Subsequently, core needle biopsy was performed using a 16 gauge core biopsy device x2 passes. Specimens were submitted in saline for further healing. Gel-Foam slurry was administered through the introducer needle as it was removed. Limited postprocedure imaging demonstrated no surrounding hematoma. A clean dressing was placed. The patient tolerated the procedure well without immediate complication IMPRESSION: Successful ultrasound-guided random renal biopsy of the left renal lower pole. Electronically Signed   By: Albin Felling  M.D.   On: 12/02/2020 16:01    Labs:  CBC: Recent Labs    12/01/20 0852 12/02/20 0500 12/02/20 1757 12/03/20 0511  WBC 10.4 6.4 10.9* 15.2*  HGB 9.6* 9.1* 8.7* 8.0*  HCT 31.6* 28.9* 28.6* 25.2*  PLT 507* 423* 410* 355    COAGS: Recent Labs    12/01/20 0852  INR 1.0    BMP: Recent Labs    11/17/20 0224 12/01/20 0852  12/02/20 0500 12/03/20 0511  NA 136 136 137 137  K 4.2 4.5 4.7 4.6  CL 106 100 106 104  CO2 23 24 22 24   GLUCOSE 97 163* 152* 116*  BUN 27* 37* 45* 56*  CALCIUM 8.4* 9.3 8.7* 8.6*  CREATININE 2.05* 2.66* 2.50* 2.95*  GFRNONAA 35* 26* 27* 23*    LIVER FUNCTION TESTS: Recent Labs    12/01/20 0852  BILITOT 0.5  AST 12*  ALT 12  ALKPHOS 73  PROT 7.5  ALBUMIN 3.1*    Assessment and Plan:  Urine today is pink tinged per pt He denies dysuria, retention Pt reports 2/10 L flank pain described as discomfort L renal bx site is clean and dry No redness, drainage, bleeding or other signs of infection Call IR if any needs   Electronically Signed: Tyson Alias, NP 12/03/2020, 8:02 AM   I spent a total of 15 Minutes at the the patient's bedside AND on the patient's hospital floor or unit, greater than 50% of which was counseling/coordinating care for s/p random L renal bx

## 2020-12-03 NOTE — Progress Notes (Signed)
DISCHARGE NOTE HOME Sergio Sims to be discharged Home per MD order. Discussed prescriptions and follow up appointments with the patient. Prescriptions given to patient; medication list explained in detail. Patient verbalized understanding.  Skin clean, dry and intact without evidence of skin break down, no evidence of skin tears noted. IV catheter discontinued intact. Site without signs and symptoms of complications. Dressing and pressure applied. Pt denies pain at the site currently. No complaints noted.  Patient free of lines, drains, and wounds.   An After Visit Summary (AVS) was printed and given to the patient. Patient escorted via wheelchair, and discharged home via private auto.  Arlyss Repress, RN

## 2020-12-03 NOTE — Progress Notes (Signed)
Patient ID: Sergio Sims, male   DOB: [birth year], 65 y.o.   MRN: [id number]  [facility] Progress Note   Assessment/ Plan:   1. Acute kidney Injury: Serological testing suggestive of ANCA vasculitis and started on corticosteroid loading with Solu-Medrol (third dose today) with renal biopsy done yesterday.  He had frank hematuria post biopsy but has sequential reduction on subsequent urine passage.  His hemoglobin/hematocrit have dropped over the last 48 hours and the plan is to recheck a CBC today at 1400-if stable, discharge home today on oral prednisone 60 mg daily to start tomorrow with outpatient nephrology follow-up with [name]. 2.  Hypertension: Blood pressure under decent control, on treatment with amlodipine 3.  Anemia: Likely secondary to hematuria/acute illness and kidney biopsy associated bleeding.  Recheck CBC this afternoon to ensure stability prior to discharge.  May or may not need ESA as an outpatient.  Subjective:   Some intermittent discomfort over left flank-renal biopsy site.  Urine this morning had a pink tinge to it but significantly more diluted than frank hematuria that he had yesterday immediately after biopsy.   Objective:   BP 125/70 (BP Location: Right Arm)   Pulse 83   Temp 98.2 F (36.8 C) (Oral)   Resp 18   SpO2 98%   Intake/Output Summary (Last 24 hours) at 12/03/2020 4098 Last data filed at 12/03/2020 0600 Gross per 24 hour  Intake 2542.98 ml  Output 0 ml  Net 2542.98 ml   Weight change:   Physical Exam: Gen: Ambulating around hallway without problem CVS: Pulse regular rhythm, normal rate, S1 and S2 normal Resp: Clear to auscultation bilaterally, no rales/rhonchi Abd: Soft, flat, nontender, bowel sounds normal Ext: No lower extremity edema  Imaging: US BIOPSY (KIDNEY)  Result Date: 12/02/2020 INDICATION: Acute renal failure EXAM: Ultrasound-guided random renal biopsy MEDICATIONS: None. ANESTHESIA/SEDATION: Moderate (conscious)  sedation was employed during this procedure. A total of Versed 2 mg and Fentanyl 50 mcg was administered intravenously. Moderate Sedation Time: 15 minutes. The patient's level of consciousness and vital signs were monitored continuously by radiology nursing throughout the procedure under my direct supervision. FLUOROSCOPY TIME:  N/a COMPLICATIONS: None immediate. PROCEDURE: Informed written consent was obtained from the patient after a thorough discussion of the procedural risks, benefits and alternatives. All questions were addressed. Maximal Sterile Barrier Technique was utilized including caps, mask, sterile gowns, sterile gloves, sterile drape, hand hygiene and skin antiseptic. A timeout was performed prior to the initiation of the procedure. The patient was placed prone on the exam table. Limited ultrasound of the bilateral flanks US performed for planning purposes. Appropriate biopsy location was selected of the left renal lower pole, and the overlying skin was prepped and draped in the standard sterile fashion. Local analgesia was obtained with 1% lidocaine. Using ultrasound guidance, a 15 gauge introducer needle was advanced just deep to the cortex of the left renal lower pole. Subsequently, core needle biopsy was performed using a 16 gauge core biopsy device x2 passes. Specimens were submitted in saline for further healing. Gel-Foam slurry was administered through the introducer needle as it was removed. Limited postprocedure imaging demonstrated no surrounding hematoma. A clean dressing was placed. The patient tolerated the procedure well without immediate complication IMPRESSION: Successful ultrasound-guided random renal biopsy of the left renal lower pole. Electronically Signed   By: Albin Felling M.D.   On: 12/02/2020 16:01    Labs: BMET Recent Labs  Lab 12/01/20 0852 12/02/20 0500 12/03/20 0511  NA 136 137 137  K 4.5 4.7 4.6  CL 100 106 104  CO2 24 22 24   GLUCOSE 163* 152* 116*  BUN 37*  45* 56*  CREATININE 2.66* 2.50* 2.95*  CALCIUM 9.3 8.7* 8.6*   CBC Recent Labs  Lab 12/01/20 0852 12/02/20 0500 12/02/20 1757 12/03/20 0511  WBC 10.4 6.4 10.9* 15.2*  NEUTROABS 9.0*  --   --   --   HGB 9.6* 9.1* 8.7* 8.0*  HCT 31.6* 28.9* 28.6* 25.2*  MCV 87.8 86.8 86.9 86.0  PLT 507* 423* 410* 355   Medications:     amLODipine  5 mg Oral Daily   docusate sodium  100 mg Oral BID   [START ON 12/04/2020] predniSONE  60 mg Oral Q breakfast   Elmarie Shiley, MD 12/03/2020, 9:42 AM

## 2020-12-03 NOTE — Discharge Summary (Signed)
Physician Discharge Summary  Sergio Sims GUY:[id number] DOB: Oct 03, 1955 DOA: 12/01/2020  PCP: Pcp, No  Admit date: 12/01/2020 Discharge date: 12/03/2020  Admitted From: Home  Disposition:  Home   Recommendations for Outpatient Follow-up:  Follow up with Nephrology [name] in 1 week Follow up with [facility] as directed Follow up with PCP in 1-2 weeks      Home Health: None  Equipment/Devices: None new  Discharge Condition: Good  CODE STATUS: FULL Diet recommendation: Regular  Brief/Interim Summary: Sergio Sims is a 65 y.o. M with HTN as well as recent renal insufficiency in the setting of ANCA+ serologies who presented for renal biopsy.   Earlier this year, patient had developed several months of progressive weakness, dyspnea on exertion, fatigue, and weight loss.  Hospitalized at the end of last month for AKI, after which ANCA serologies were positive.  Nephrology requested admission for renal biopsy.     PRINCIPAL HOSPITAL DIAGNOSIS: Acute renal failure due to likely ANCA vasculitis    Discharge Diagnoses:   Acute renal failure Likely ANCA associated vasculitis Patient was admitted and nephrology were consulted, he was started on high-dose steroids.     Underwent renal biopsy 8/15.  Post procedure, he had an expected amount of gross hematuria, which cleared over the subsequent 24 hours.  Hemoglobin dipped slightly but remained stable from yesterday to today.  No tachycardia or hypotension, no increasing flank pain.  He was discharged on prednisone 60 mg daily, and has follow-up with nephrology in 1 week.      Hypertension  ANCA associated anemia          Discharge Instructions  Discharge Instructions     Discharge instructions   Complete by: As directed    From Dr. Loleta Books: You were admitted for kidney biopsy. Your biopsy went like normal, and thankfully, you have had no complications.  Your hemoglobin (blood level) is stable.  Follow up  with [name] from [facility]  Follow up with your primary care doctor in 1 week Follow up with the Rheumatologists at [facility] as directed   For treatment of the kidney disease, take prednisone You may have a prescription for 40 mg twice daily at home, we recommend you take instead the new prescription for prednisone 60 mg once daily  [name] can direct you how to taper this  Return for fever, severe pain in your left flank, or worsening blood in the urine   Increase activity slowly   Complete by: As directed    No wound care   Complete by: As directed       Allergies as of 12/03/2020   No Known Allergies      Medication List     TAKE these medications    acetaminophen 500 MG tablet Commonly known as: TYLENOL Take 500 mg by mouth every 6 (six) hours as needed for mild pain.   amLODipine 5 MG tablet Commonly known as: NORVASC Take 1 tablet (5 mg total) by mouth daily.   ferrous sulfate 325 (65 FE) MG tablet Take 1 tablet (325 mg total) by mouth daily.   HYDROcodone-acetaminophen 5-325 MG tablet Commonly known as: NORCO/VICODIN Take 1-2 tablets by mouth every 6 (six) hours as needed for moderate pain.   predniSONE 20 MG tablet Commonly known as: DELTASONE Take 3 tablets (60 mg total) by mouth daily with breakfast. Start taking on: December 04, 2020 What changed:  how much to take when to take this  Follow-up Information     Pieter Partridge, PA Follow up.   Specialty: Family Medicine Contact information: [address] [place] [address] [phone]         Sampson Si, [place] Follow up.   Specialty: Physician Assistant Contact information: [place] [address] [phone]         Reesa Chew, MD. Go on 12/10/2020.   Specialty: Internal Medicine Why: Appointment at 10:30 AM Contact information: [place] [address] [phone]                No Known  Allergies  Consultations: Nephrology Interventional Radiology   Procedures/Studies: DG Chest 2 View  Result Date: 11/15/2020 CLINICAL DATA:  Recent infection EXAM: CHEST - 2 VIEW COMPARISON:  CT 09/27/2020 FINDINGS: Previously seen small nodules by CT are not well visualized by plain film and should be followed by CT. Heart is normal size. Probable scarring in the lung bases. Small right pleural effusion again suspected, unchanged. IMPRESSION: Bibasilar opacities, right greater than left, favor scarring. Small right pleural effusion. Previously seen small pulmonary nodules not well visualized by plain films and can be followed with CT as clinically indicated. Electronically Signed   By: Rolm Baptise M.D.   On: 11/15/2020 19:07   US Renal  Result Date: 11/15/2020 CLINICAL DATA:  Renal failure. EXAM: RENAL / URINARY TRACT ULTRASOUND COMPLETE COMPARISON:  None. FINDINGS: Right Kidney: Renal measurements: 12.8 x 6.6 x 6.0 cm = volume: 262.7 mL. Echogenicity appears increased. Two simple cysts are seen measuring 1.0 and 2.3 cm. No solid mass or hydronephrosis visualized. Left Kidney: Renal measurements: 12.1 x 6.8 x 6.4 cm = volume: 276 mL. Echogenicity appears increased. 0.7 cm cyst noted. No solid mass or hydronephrosis visualized. Bladder: Appears normal for degree of bladder distention. Other: Prostate gland appears enlarged. IMPRESSION: Negative for hydronephrosis or acute abnormality. Increased cortical echogenicity compatible with medical renal disease. Electronically Signed   By: Inge Rise M.D.   On: 11/15/2020 18:54   US BIOPSY (KIDNEY)  Result Date: 12/02/2020 INDICATION: Acute renal failure EXAM: Ultrasound-guided random renal biopsy MEDICATIONS: None. ANESTHESIA/SEDATION: Moderate (conscious) sedation was employed during this procedure. A total of Versed 2 mg and Fentanyl 50 mcg was administered intravenously. Moderate Sedation Time: 15 minutes. The patient's level of consciousness  and vital signs were monitored continuously by radiology nursing throughout the procedure under my direct supervision. FLUOROSCOPY TIME:  N/a COMPLICATIONS: None immediate. PROCEDURE: Informed written consent was obtained from the patient after a thorough discussion of the procedural risks, benefits and alternatives. All questions were addressed. Maximal Sterile Barrier Technique was utilized including caps, mask, sterile gowns, sterile gloves, sterile drape, hand hygiene and skin antiseptic. A timeout was performed prior to the initiation of the procedure. The patient was placed prone on the exam table. Limited ultrasound of the bilateral flanks US performed for planning purposes. Appropriate biopsy location was selected of the left renal lower pole, and the overlying skin was prepped and draped in the standard sterile fashion. Local analgesia was obtained with 1% lidocaine. Using ultrasound guidance, a 15 gauge introducer needle was advanced just deep to the cortex of the left renal lower pole. Subsequently, core needle biopsy was performed using a 16 gauge core biopsy device x2 passes. Specimens were submitted in saline for further healing. Gel-Foam slurry was administered through the introducer needle as it was removed. Limited postprocedure imaging demonstrated no surrounding hematoma. A clean dressing was placed. The  patient tolerated the procedure well without immediate complication IMPRESSION: Successful ultrasound-guided random renal biopsy of the left renal lower pole. Electronically Signed   By: Albin Felling M.D.   On: 12/02/2020 16:01      Subjective: Moderate left flank pain.  No dizziness.  Hematuria is resolving.  Discharge Exam: Vitals:   12/03/20 0532 12/03/20 0926  BP: 134/73 125/70  Pulse: (!) 58 83  Resp: 18 18  Temp: (!) 97.4 F (36.3 C) 98.2 F (36.8 C)  SpO2: 100% 98%   Vitals:   12/02/20 1708 12/02/20 2109 12/03/20 0532 12/03/20 0926  BP: (!) 148/75 139/68 134/73 125/70   Pulse: 69 73 (!) 58 83  Resp: 18 18 18 18   Temp: 98.4 F (36.9 C) 98.6 F (37 C) (!) 97.4 F (36.3 C) 98.2 F (36.8 C)  TempSrc: Oral Oral Oral Oral  SpO2: 100% 100% 100% 98%    General: Pt is alert, awake, not in acute distress Cardiovascular: RRR, nl S1-S2, no murmurs appreciated.   No LE edema.   Respiratory: Normal respiratory rate and rhythm.  CTAB without rales or wheezes. Abdominal: Abdomen soft and non-tender.  No distension or HSM.   Neuro/Psych: Strength symmetric in upper and lower extremities.  Judgment and insight appear normal.   The results of significant diagnostics from this hospitalization (including imaging, microbiology, ancillary and laboratory) are listed below for reference.     Microbiology: Recent Results (from the past 240 hour(s))  Resp Panel by RT-PCR (Flu A&B, Covid) Nasopharyngeal Swab     Status: None   Collection Time: 12/01/20  5:06 PM   Specimen: Nasopharyngeal Swab; Nasopharyngeal(NP) swabs in vial transport medium  Result Value Ref Range Status   SARS Coronavirus 2 by RT PCR NEGATIVE NEGATIVE Final    Comment: (NOTE) SARS-CoV-2 target nucleic acids are NOT DETECTED.  The SARS-CoV-2 RNA is generally detectable in upper respiratory specimens during the acute phase of infection. The lowest concentration of SARS-CoV-2 viral copies this assay can detect is 138 copies/mL. A negative result does not preclude SARS-Cov-2 infection and should not be used as the sole basis for treatment or other patient management decisions. A negative result may occur with  improper specimen collection/handling, submission of specimen other than nasopharyngeal swab, presence of viral mutation(s) within the areas targeted by this assay, and inadequate number of viral copies(<138 copies/mL). A negative result must be combined with clinical observations, patient history, and epidemiological information. The expected result is Negative.  Fact Sheet for Patients:   EntrepreneurPulse.com.au  Fact Sheet for Healthcare Providers:  IncredibleEmployment.be  This test is no t yet approved or cleared by the [place] FDA and  has been authorized for detection and/or diagnosis of SARS-CoV-2 by FDA under an Emergency Use Authorization (EUA). This EUA will remain  in effect (meaning this test can be used) for the duration of the COVID-19 declaration under Section 564(b)(1) of the Act, 21 U.S.C.section 360bbb-3(b)(1), unless the authorization is terminated  or revoked sooner.       Influenza A by PCR NEGATIVE NEGATIVE Final   Influenza B by PCR NEGATIVE NEGATIVE Final    Comment: (NOTE) The Xpert Xpress SARS-CoV-2/FLU/RSV plus assay is intended as an aid in the diagnosis of influenza from Nasopharyngeal swab specimens and should not be used as a sole basis for treatment. Nasal washings and aspirates are unacceptable for Xpert Xpress SARS-CoV-2/FLU/RSV testing.  Fact Sheet for Patients: EntrepreneurPulse.com.au  Fact Sheet for Healthcare Providers: IncredibleEmployment.be  This test is not yet  approved or cleared by the [place] and has been authorized for detection and/or diagnosis of SARS-CoV-2 by FDA under an Emergency Use Authorization (EUA). This EUA will remain in effect (meaning this test can be used) for the duration of the COVID-19 declaration under Section 564(b)(1) of the Act, 21 U.S.C. section 360bbb-3(b)(1), unless the authorization is terminated or revoked.  Performed at [facility], [place] [address], [place], [place] [address]      Labs: BNP (last 3 results) No results for input(s): BNP in the last 8760 hours. Basic Metabolic Panel: Recent Labs  Lab 12/01/20 0852 12/02/20 0500 12/03/20 0511  NA 136 137 137  K 4.5 4.7 4.6  CL 100 106 104  CO2 24 22 24   GLUCOSE 163* 152* 116*  BUN 37* 45* 56*  CREATININE 2.66* 2.50* 2.95*   CALCIUM 9.3 8.7* 8.6*   Liver Function Tests: Recent Labs  Lab 12/01/20 0852  AST 12*  ALT 12  ALKPHOS 73  BILITOT 0.5  PROT 7.5  ALBUMIN 3.1*   No results for input(s): LIPASE, AMYLASE in the last 168 hours. No results for input(s): AMMONIA in the last 168 hours. CBC: Recent Labs  Lab 12/01/20 0852 12/02/20 0500 12/02/20 1757 12/03/20 0511 12/03/20 1432  WBC 10.4 6.4 10.9* 15.2* 14.1*  NEUTROABS 9.0*  --   --   --   --   HGB 9.6* 9.1* 8.7* 8.0* 8.4*  HCT 31.6* 28.9* 28.6* 25.2* 26.4*  MCV 87.8 86.8 86.9 86.0 86.6  PLT 507* 423* 410* 355 377   Cardiac Enzymes: No results for input(s): CKTOTAL, CKMB, CKMBINDEX, TROPONINI in the last 168 hours. BNP: Invalid input(s): POCBNP CBG: No results for input(s): GLUCAP in the last 168 hours. D-Dimer No results for input(s): DDIMER in the last 72 hours. Hgb A1c No results for input(s): HGBA1C in the last 72 hours. Lipid Profile No results for input(s): CHOL, HDL, LDLCALC, TRIG, CHOLHDL, LDLDIRECT in the last 72 hours. Thyroid function studies No results for input(s): TSH, T4TOTAL, T3FREE, THYROIDAB in the last 72 hours.  Invalid input(s): FREET3 Anemia work up No results for input(s): VITAMINB12, FOLATE, FERRITIN, TIBC, IRON, RETICCTPCT in the last 72 hours. Urinalysis    Component Value Date/Time   COLORURINE YELLOW 12/01/2020 0852   APPEARANCEUR HAZY (A) 12/01/2020 0852   LABSPEC 1.010 12/01/2020 0852   PHURINE 5.0 12/01/2020 0852   GLUCOSEU NEGATIVE 12/01/2020 0852   HGBUR LARGE (A) 12/01/2020 0852   BILIRUBINUR NEGATIVE 12/01/2020 0852   KETONESUR NEGATIVE 12/01/2020 0852   PROTEINUR 30 (A) 12/01/2020 0852   NITRITE NEGATIVE 12/01/2020 0852   LEUKOCYTESUR NEGATIVE 12/01/2020 0852   Sepsis Labs Invalid input(s): PROCALCITONIN,  WBC,  LACTICIDVEN Microbiology Recent Results (from the past 240 hour(s))  Resp Panel by RT-PCR (Flu A&B, Covid) Nasopharyngeal Swab     Status: None   Collection Time: 12/01/20   5:06 PM   Specimen: Nasopharyngeal Swab; Nasopharyngeal(NP) swabs in vial transport medium  Result Value Ref Range Status   SARS Coronavirus 2 by RT PCR NEGATIVE NEGATIVE Final    Comment: (NOTE) SARS-CoV-2 target nucleic acids are NOT DETECTED.  The SARS-CoV-2 RNA is generally detectable in upper respiratory specimens during the acute phase of infection. The lowest concentration of SARS-CoV-2 viral copies this assay can detect is 138 copies/mL. A negative result does not preclude SARS-Cov-2 infection and should not be used as the sole basis for treatment or other patient management decisions. A negative result may occur with  improper  specimen collection/handling, submission of specimen other than nasopharyngeal swab, presence of viral mutation(s) within the areas targeted by this assay, and inadequate number of viral copies(<138 copies/mL). A negative result must be combined with clinical observations, patient history, and epidemiological information. The expected result is Negative.  Fact Sheet for Patients:  EntrepreneurPulse.com.au  Fact Sheet for Healthcare Providers:  IncredibleEmployment.be  This test is no t yet approved or cleared by the [place] FDA and  has been authorized for detection and/or diagnosis of SARS-CoV-2 by FDA under an Emergency Use Authorization (EUA). This EUA will remain  in effect (meaning this test can be used) for the duration of the COVID-19 declaration under Section 564(b)(1) of the Act, 21 U.S.C.section 360bbb-3(b)(1), unless the authorization is terminated  or revoked sooner.       Influenza A by PCR NEGATIVE NEGATIVE Final   Influenza B by PCR NEGATIVE NEGATIVE Final    Comment: (NOTE) The Xpert Xpress SARS-CoV-2/FLU/RSV plus assay is intended as an aid in the diagnosis of influenza from Nasopharyngeal swab specimens and should not be used as a sole basis for treatment. Nasal washings and aspirates  are unacceptable for Xpert Xpress SARS-CoV-2/FLU/RSV testing.  Fact Sheet for Patients: EntrepreneurPulse.com.au  Fact Sheet for Healthcare Providers: IncredibleEmployment.be  This test is not yet approved or cleared by the [place] FDA and has been authorized for detection and/or diagnosis of SARS-CoV-2 by FDA under an Emergency Use Authorization (EUA). This EUA will remain in effect (meaning this test can be used) for the duration of the COVID-19 declaration under Section 564(b)(1) of the Act, 21 U.S.C. section 360bbb-3(b)(1), unless the authorization is terminated or revoked.  Performed at [facility], [place] [address], [place], [place] [address]      Time coordinating discharge:  25 minutes The [place] controlled substances registry was reviewed for this patient prior to filling the <5 days supply controlled substances script.    30 Day Unplanned Readmission Risk Score    Flowsheet Row ED to Hosp-Admission (Current) from 12/01/2020 in [facility] [address]  30 Day Unplanned Readmission Risk Score (%) 11.78 Filed at 12/03/2020 0400       This score is the patient's risk of an unplanned readmission within 30 days of being discharged (0 -100%). The score is based on dignosis, age, lab data, medications, orders, and past utilization.   Low:  0-14.9   Medium: 15-21.9   High: 22-29.9   Extreme: 30 and above            SIGNED:   Edwin Dada, MD  [facility] 12/03/2020, 3:37 PM

## 2020-12-03 NOTE — TOC Initial Note (Signed)
Transition of Care [facility]) - Initial/Assessment Note    Patient Details  Name: Sergio Sims MRN: [id number] Date of Birth: Jun 10, 1955  Transition of Care [facility]) CM/SW Contact:    Tom-Johnson, Renea Ee, RN Phone Number: 12/03/2020, 2:09 PM  Clinical Narrative:                 Spoke with patient about TOC needs. Patient lives with wife who is at bedside. Drives himself to and from appointments. Has PCP and has no problem obtaining medications. Has wheelchair and a cane at home. Does not need any DME at this time. Patient denies any needs. No further TOC needs at this time.  Expected Discharge Plan: Home/Self Care Barriers to Discharge: No Barriers Identified   Patient Goals and CMS Choice Patient states their goals for this hospitalization and ongoing recovery are:: To go home      Expected Discharge Plan and Services Expected Discharge Plan: Home/Self Care In-house Referral: NA Discharge Planning Services: NA   Living arrangements for the past 2 months: Single Family Home                 DME Arranged: N/A DME Agency: NA       HH Arranged: NA HH Agency: NA        Prior Living Arrangements/Services Living arrangements for the past 2 months: Single Family Home Lives with:: Spouse Patient language and need for interpreter reviewed:: Yes Do you feel safe going back to the place where you live?: Yes      Need for Family Participation in Patient Care: Yes (Comment) Care giver support system in place?: Yes (comment)   Criminal Activity/Legal Involvement Pertinent to Current Situation/Hospitalization: No - Comment as needed  Activities of Daily Living Home Assistive Devices/Equipment: None ADL Screening (condition at time of admission) Patient's cognitive ability adequate to safely complete daily activities?: Yes Is the patient deaf or have difficulty hearing?: No Does the patient have difficulty seeing, even when wearing glasses/contacts?: No Does the patient have  difficulty concentrating, remembering, or making decisions?: No Patient able to express need for assistance with ADLs?: Yes Does the patient have difficulty dressing or bathing?: No Independently performs ADLs?: Yes (appropriate for developmental age) Does the patient have difficulty walking or climbing stairs?: No Weakness of Legs: None Weakness of Arms/Hands: None  Permission Sought/Granted Permission sought to share information with : Case Manager Permission granted to share information with : Yes, Verbal Permission Granted              Emotional Assessment Appearance:: Appears stated age Attitude/Demeanor/Rapport: Engaged Affect (typically observed): Accepting Orientation: : Oriented to Self, Oriented to Place, Oriented to  Time, Oriented to Situation   Psych Involvement: No (comment)  Admission diagnosis:  ANCA-associated vasculitis ([place]) [I77.6] Abnormal ANCA test [R76.8] Patient Active Problem List   Diagnosis Date Noted   ANCA-associated vasculitis ([place]) 12/01/2020   Essential hypertension 12/01/2020   AKI (acute kidney injury) ([place]) 11/15/2020   PCP:  Merryl Hacker No Pharmacy:   [place], [place] [address] Phone: [phone] Fax: [phone]     Social Determinants of Health (SDOH) Interventions    Readmission Risk Interventions No flowsheet data found.

## 2020-12-03 NOTE — TOC Transition Note (Signed)
Transition of Care [facility]) - CM/SW Discharge Note   Patient Details  Name: Terance Pomplun MRN: [id number] Date of Birth: 1956/02/06  [facility]) CM/SW Contact:  Tom-Johnson, Renea Ee, RN Phone Number: 12/03/2020, 4:08 PM   Clinical Narrative:    Patient is discharged home. No further TOC needs at this time.   Final next level of care: Home/Self Care Barriers to Discharge: No Barriers Identified   Patient Goals and CMS Choice Patient states their goals for this hospitalization and ongoing recovery are:: To go home with spouse      Discharge Placement                       Discharge Plan and Services In-house Referral: NA Discharge Planning Services: NA            DME Arranged: N/A DME Agency: NA       HH Arranged: NA HH Agency: NA        Social Determinants of Health (SDOH) Interventions     Readmission Risk Interventions No flowsheet data found.

## 2020-12-05 ENCOUNTER — Ambulatory Visit: Payer: Medicare HMO | Admitting: Family Medicine

## 2020-12-05 ENCOUNTER — Other Ambulatory Visit: Payer: Self-pay | Admitting: Internal Medicine

## 2020-12-05 DIAGNOSIS — R319 Hematuria, unspecified: Secondary | ICD-10-CM

## 2020-12-06 ENCOUNTER — Other Ambulatory Visit: Payer: Self-pay | Admitting: Internal Medicine

## 2020-12-06 ENCOUNTER — Ambulatory Visit
Admission: RE | Admit: 2020-12-06 | Discharge: 2020-12-06 | Disposition: A | Discharge status: Home / Self Care | Payer: Medicare HMO | Source: Ambulatory Visit | Attending: Internal Medicine | Admitting: Internal Medicine

## 2020-12-06 DIAGNOSIS — R319 Hematuria, unspecified: Secondary | ICD-10-CM | POA: Diagnosis not present

## 2020-12-06 DIAGNOSIS — S3722XA Contusion of bladder, initial encounter: Secondary | ICD-10-CM | POA: Diagnosis not present

## 2020-12-06 DIAGNOSIS — N3289 Other specified disorders of bladder: Secondary | ICD-10-CM | POA: Diagnosis not present

## 2020-12-06 DIAGNOSIS — N059 Unspecified nephritic syndrome with unspecified morphologic changes: Secondary | ICD-10-CM

## 2020-12-06 DIAGNOSIS — N281 Cyst of kidney, acquired: Secondary | ICD-10-CM | POA: Diagnosis not present

## 2020-12-06 DIAGNOSIS — N184 Chronic kidney disease, stage 4 (severe): Secondary | ICD-10-CM | POA: Diagnosis not present

## 2020-12-09 ENCOUNTER — Other Ambulatory Visit: Payer: Self-pay | Admitting: Radiology

## 2020-12-10 ENCOUNTER — Other Ambulatory Visit: Payer: Self-pay

## 2020-12-10 ENCOUNTER — Inpatient Hospital Stay (HOSPITAL_COMMUNITY)
Admission: EM | Admit: 2020-12-10 | Discharge: 2020-12-13 | DRG: 920 | Disposition: A | Discharge status: Home / Self Care | Payer: Medicare HMO | Attending: Internal Medicine | Admitting: Internal Medicine

## 2020-12-10 ENCOUNTER — Ambulatory Visit (HOSPITAL_COMMUNITY): Admission: RE | Admit: 2020-12-10 | Payer: Medicare HMO | Source: Ambulatory Visit

## 2020-12-10 ENCOUNTER — Emergency Department (HOSPITAL_COMMUNITY): Payer: Medicare HMO

## 2020-12-10 ENCOUNTER — Encounter (HOSPITAL_COMMUNITY): Payer: Self-pay

## 2020-12-10 DIAGNOSIS — I129 Hypertensive chronic kidney disease with stage 1 through stage 4 chronic kidney disease, or unspecified chronic kidney disease: Secondary | ICD-10-CM | POA: Diagnosis present

## 2020-12-10 DIAGNOSIS — Y848 Other medical procedures as the cause of abnormal reaction of the patient, or of later complication, without mention of misadventure at the time of the procedure: Secondary | ICD-10-CM | POA: Diagnosis not present

## 2020-12-10 DIAGNOSIS — I7789 Other specified disorders of arteries and arterioles: Secondary | ICD-10-CM | POA: Diagnosis present

## 2020-12-10 DIAGNOSIS — Z7952 Long term (current) use of systemic steroids: Secondary | ICD-10-CM | POA: Diagnosis not present

## 2020-12-10 DIAGNOSIS — N184 Chronic kidney disease, stage 4 (severe): Secondary | ICD-10-CM | POA: Diagnosis present

## 2020-12-10 DIAGNOSIS — Z87891 Personal history of nicotine dependence: Secondary | ICD-10-CM | POA: Diagnosis not present

## 2020-12-10 DIAGNOSIS — E663 Overweight: Secondary | ICD-10-CM | POA: Diagnosis present

## 2020-12-10 DIAGNOSIS — Z79899 Other long term (current) drug therapy: Secondary | ICD-10-CM | POA: Diagnosis not present

## 2020-12-10 DIAGNOSIS — Y733 Surgical instruments, materials and gastroenterology and urology devices (including sutures) associated with adverse incidents: Secondary | ICD-10-CM | POA: Diagnosis not present

## 2020-12-10 DIAGNOSIS — E871 Hypo-osmolality and hyponatremia: Secondary | ICD-10-CM | POA: Diagnosis present

## 2020-12-10 DIAGNOSIS — D509 Iron deficiency anemia, unspecified: Secondary | ICD-10-CM | POA: Diagnosis present

## 2020-12-10 DIAGNOSIS — I10 Essential (primary) hypertension: Secondary | ICD-10-CM | POA: Diagnosis present

## 2020-12-10 DIAGNOSIS — D72829 Elevated white blood cell count, unspecified: Secondary | ICD-10-CM | POA: Diagnosis not present

## 2020-12-10 DIAGNOSIS — I776 Arteritis, unspecified: Secondary | ICD-10-CM | POA: Diagnosis not present

## 2020-12-10 DIAGNOSIS — N1330 Unspecified hydronephrosis: Secondary | ICD-10-CM | POA: Diagnosis not present

## 2020-12-10 DIAGNOSIS — D649 Anemia, unspecified: Secondary | ICD-10-CM | POA: Diagnosis not present

## 2020-12-10 DIAGNOSIS — N179 Acute kidney failure, unspecified: Secondary | ICD-10-CM | POA: Diagnosis not present

## 2020-12-10 DIAGNOSIS — R319 Hematuria, unspecified: Secondary | ICD-10-CM | POA: Insufficient documentation

## 2020-12-10 DIAGNOSIS — R110 Nausea: Secondary | ICD-10-CM | POA: Diagnosis present

## 2020-12-10 DIAGNOSIS — Z20822 Contact with and (suspected) exposure to covid-19: Secondary | ICD-10-CM | POA: Diagnosis not present

## 2020-12-10 DIAGNOSIS — N99820 Postprocedural hemorrhage and hematoma of a genitourinary system organ or structure following a genitourinary system procedure: Secondary | ICD-10-CM | POA: Diagnosis not present

## 2020-12-10 DIAGNOSIS — D638 Anemia in other chronic diseases classified elsewhere: Secondary | ICD-10-CM | POA: Diagnosis not present

## 2020-12-10 DIAGNOSIS — R310 Gross hematuria: Secondary | ICD-10-CM | POA: Diagnosis not present

## 2020-12-10 DIAGNOSIS — N281 Cyst of kidney, acquired: Secondary | ICD-10-CM | POA: Diagnosis not present

## 2020-12-10 DIAGNOSIS — N320 Bladder-neck obstruction: Secondary | ICD-10-CM | POA: Diagnosis not present

## 2020-12-10 DIAGNOSIS — D62 Acute posthemorrhagic anemia: Secondary | ICD-10-CM | POA: Diagnosis not present

## 2020-12-10 DIAGNOSIS — N189 Chronic kidney disease, unspecified: Secondary | ICD-10-CM | POA: Diagnosis not present

## 2020-12-10 DIAGNOSIS — I7782 Antineutrophilic cytoplasmic antibody (ANCA) vasculitis: Secondary | ICD-10-CM

## 2020-12-10 DIAGNOSIS — N3289 Other specified disorders of bladder: Secondary | ICD-10-CM | POA: Diagnosis not present

## 2020-12-10 DIAGNOSIS — Z6828 Body mass index (BMI) 28.0-28.9, adult: Secondary | ICD-10-CM

## 2020-12-10 LAB — CBC
HCT: 29.3 % — ABNORMAL LOW (ref 39.0–52.0)
HCT: 30.2 % — ABNORMAL LOW (ref 39.0–52.0)
Hemoglobin: 9.1 g/dL — ABNORMAL LOW (ref 13.0–17.0)
Hemoglobin: 9.3 g/dL — ABNORMAL LOW (ref 13.0–17.0)
MCH: 27.2 pg (ref 26.0–34.0)
MCH: 27.5 pg (ref 26.0–34.0)
MCHC: 31.1 g/dL (ref 30.0–36.0)
MCHC: 30.8 g/dL (ref 30.0–36.0)
MCV: 87.7 fL (ref 80.0–100.0)
MCV: 89.3 fL (ref 80.0–100.0)
Platelets: 363 10*3/uL (ref 150–400)
Platelets: 323 10*3/uL (ref 150–400)
RBC: 3.34 MIL/uL — ABNORMAL LOW (ref 4.22–5.81)
RBC: 3.38 MIL/uL — ABNORMAL LOW (ref 4.22–5.81)
RDW: 17.9 % — ABNORMAL HIGH (ref 11.5–15.5)
RDW: 17.9 % — ABNORMAL HIGH (ref 11.5–15.5)
WBC: 18.7 10*3/uL — ABNORMAL HIGH (ref 4.0–10.5)
WBC: 16.8 10*3/uL — ABNORMAL HIGH (ref 4.0–10.5)
nRBC: 0 % (ref 0.0–0.2)
nRBC: 0 % (ref 0.0–0.2)

## 2020-12-10 LAB — URINALYSIS, ROUTINE W REFLEX MICROSCOPIC
Bilirubin Urine: NEGATIVE
Glucose, UA: 50 mg/dL — AB
Ketones, ur: NEGATIVE mg/dL
Nitrite: NEGATIVE
Protein, ur: 100 mg/dL — AB
RBC / HPF: >50 RBC/hpf — ABNORMAL HIGH (ref 0–5)
Specific Gravity, Urine: 1.01 (ref 1.005–1.030)
pH: 6 (ref 5.0–8.0)

## 2020-12-10 LAB — CBC WITH DIFFERENTIAL/PLATELET
Abs Immature Granulocytes: 0.31 10*3/uL — ABNORMAL HIGH (ref 0.00–0.07)
Basophils Absolute: 0 10*3/uL (ref 0.0–0.1)
Basophils Relative: 0 %
Eosinophils Absolute: 0 10*3/uL (ref 0.0–0.5)
Eosinophils Relative: 0 %
HCT: 29.3 % — ABNORMAL LOW (ref 39.0–52.0)
Hemoglobin: 9.1 g/dL — ABNORMAL LOW (ref 13.0–17.0)
Immature Granulocytes: 2 %
Lymphocytes Relative: 7 %
Lymphs Abs: 1.3 10*3/uL (ref 0.7–4.0)
MCH: 27.2 pg (ref 26.0–34.0)
MCHC: 31.1 g/dL (ref 30.0–36.0)
MCV: 87.7 fL (ref 80.0–100.0)
Monocytes Absolute: 1.3 10*3/uL — ABNORMAL HIGH (ref 0.1–1.0)
Monocytes Relative: 7 %
Neutro Abs: 16.2 10*3/uL — ABNORMAL HIGH (ref 1.7–7.7)
Neutrophils Relative %: 84 %
Platelets: 359 10*3/uL (ref 150–400)
RBC: 3.34 MIL/uL — ABNORMAL LOW (ref 4.22–5.81)
RDW: 17.8 % — ABNORMAL HIGH (ref 11.5–15.5)
WBC: 19.1 10*3/uL — ABNORMAL HIGH (ref 4.0–10.5)
nRBC: 0 % (ref 0.0–0.2)

## 2020-12-10 LAB — RESP PANEL BY RT-PCR (FLU A&B, COVID) ARPGX2
Influenza A by PCR: NEGATIVE
Influenza B by PCR: NEGATIVE
SARS Coronavirus 2 by RT PCR: NEGATIVE

## 2020-12-10 LAB — BASIC METABOLIC PANEL
Anion gap: 7 (ref 5–15)
BUN: 42 mg/dL — ABNORMAL HIGH (ref 8–23)
CO2: 25 mmol/L (ref 22–32)
Calcium: 8.5 mg/dL — ABNORMAL LOW (ref 8.9–10.3)
Chloride: 100 mmol/L (ref 98–111)
Creatinine, Ser: 2.22 mg/dL — ABNORMAL HIGH (ref 0.61–1.24)
GFR, Estimated: 32 mL/min — ABNORMAL LOW (ref >60)
Glucose, Bld: 115 mg/dL — ABNORMAL HIGH (ref 70–99)
Potassium: 4.4 mmol/L (ref 3.5–5.1)
Sodium: 132 mmol/L — ABNORMAL LOW (ref 135–145)

## 2020-12-10 LAB — SURGICAL PATHOLOGY

## 2020-12-10 MED ORDER — OXYBUTYNIN CHLORIDE 5 MG PO TABS: 5.0000 mg | ORAL_TABLET | Freq: Three times a day (TID) | ORAL | Status: DC | PRN

## 2020-12-10 MED ORDER — FAMOTIDINE 20 MG PO TABS
20.0000 mg | ORAL_TABLET | Freq: Every day | ORAL | Status: DC
  Administered 2020-12-10 – 2020-12-13 (×4): 20 mg via ORAL
  Filled 2020-12-10 (×4): qty 1

## 2020-12-10 MED ORDER — BELLADONNA ALKALOIDS-OPIUM 16.2-30 MG RE SUPP: 1.0000 | Freq: Three times a day (TID) | RECTAL | Status: DC | PRN

## 2020-12-10 MED ORDER — MORPHINE SULFATE (PF) 4 MG/ML IV SOLN
4.0000 mg | Freq: Once | INTRAVENOUS | Status: AC
  Administered 2020-12-10: 4 mg via INTRAVENOUS
  Filled 2020-12-10: qty 1

## 2020-12-10 MED ORDER — SULFAMETHOXAZOLE-TRIMETHOPRIM 800-160 MG PO TABS
1.0000 | ORAL_TABLET | ORAL | Status: DC
  Administered 2020-12-11 – 2020-12-13 (×2): 1 via ORAL
  Filled 2020-12-10 (×2): qty 1

## 2020-12-10 MED ORDER — SODIUM CHLORIDE 0.9 % IR SOLN
3000.0000 mL | Status: DC
  Administered 2020-12-10 – 2020-12-13 (×8): 3000 mL

## 2020-12-10 MED ORDER — HYDRALAZINE HCL 20 MG/ML IJ SOLN: 5.0000 mg | INTRAMUSCULAR | Status: DC | PRN

## 2020-12-10 MED ORDER — ONDANSETRON HCL 4 MG PO TABS: 4.0000 mg | ORAL_TABLET | Freq: Four times a day (QID) | ORAL | Status: DC | PRN

## 2020-12-10 MED ORDER — FERROUS SULFATE 325 (65 FE) MG PO TABS
325.0000 mg | ORAL_TABLET | Freq: Every day | ORAL | Status: DC
  Administered 2020-12-11 – 2020-12-13 (×3): 325 mg via ORAL
  Filled 2020-12-10 (×3): qty 1

## 2020-12-10 MED ORDER — MORPHINE SULFATE (PF) 4 MG/ML IV SOLN
4.0000 mg | Freq: Once | INTRAVENOUS | Status: DC
Start: 2020-12-10 — End: 2020-12-10
  Filled 2020-12-10: qty 1

## 2020-12-10 MED ORDER — LACTATED RINGERS IV SOLN: INTRAVENOUS | Status: DC

## 2020-12-10 MED ORDER — FENTANYL CITRATE (PF) 100 MCG/2ML IJ SOLN
50.0000 ug | Freq: Once | INTRAMUSCULAR | Status: AC
  Administered 2020-12-10: 50 ug via INTRAVENOUS
  Filled 2020-12-10: qty 2

## 2020-12-10 MED ORDER — ONDANSETRON HCL 4 MG/2ML IJ SOLN: 4.0000 mg | Freq: Four times a day (QID) | INTRAMUSCULAR | Status: DC | PRN

## 2020-12-10 MED ORDER — LIDOCAINE 4 % EX CREA
TOPICAL_CREAM | Freq: Three times a day (TID) | CUTANEOUS | Status: DC | PRN
  Filled 2020-12-10 (×2): qty 5

## 2020-12-10 MED ORDER — DOCUSATE SODIUM 100 MG PO CAPS
100.0000 mg | ORAL_CAPSULE | Freq: Two times a day (BID) | ORAL | Status: DC
  Administered 2020-12-10 – 2020-12-13 (×7): 100 mg via ORAL
  Filled 2020-12-10 (×7): qty 1

## 2020-12-10 MED ORDER — ONDANSETRON 4 MG PO TBDP
4.0000 mg | ORAL_TABLET | Freq: Once | ORAL | Status: DC
  Filled 2020-12-10: qty 1

## 2020-12-10 MED ORDER — ACETAMINOPHEN 325 MG PO TABS
650.0000 mg | ORAL_TABLET | Freq: Four times a day (QID) | ORAL | Status: DC | PRN
  Administered 2020-12-10 – 2020-12-12 (×2): 650 mg via ORAL
  Filled 2020-12-10: qty 2

## 2020-12-10 MED ORDER — CHLORHEXIDINE GLUCONATE CLOTH 2 % EX PADS
6.0000 | MEDICATED_PAD | Freq: Every day | CUTANEOUS | Status: DC
  Administered 2020-12-10 – 2020-12-12 (×2): 6 via TOPICAL

## 2020-12-10 MED ORDER — AMLODIPINE BESYLATE 5 MG PO TABS
5.0000 mg | ORAL_TABLET | Freq: Two times a day (BID) | ORAL | Status: DC
  Administered 2020-12-10 – 2020-12-13 (×6): 5 mg via ORAL
  Filled 2020-12-10 (×6): qty 1

## 2020-12-10 MED ORDER — HYDROMORPHONE HCL 1 MG/ML IJ SOLN
0.5000 mg | INTRAMUSCULAR | Status: DC | PRN
  Administered 2020-12-10 – 2020-12-11 (×4): 0.5 mg via INTRAVENOUS
  Filled 2020-12-10: qty 0.5
  Filled 2020-12-10: qty 1
  Filled 2020-12-10 (×2): qty 0.5

## 2020-12-10 MED ORDER — POLYETHYLENE GLYCOL 3350 17 G PO PACK
17.0000 g | PACK | Freq: Every day | ORAL | Status: DC | PRN
  Administered 2020-12-11: 17 g via ORAL
  Filled 2020-12-10 (×2): qty 1

## 2020-12-10 MED ORDER — PREDNISONE 20 MG PO TABS
60.0000 mg | ORAL_TABLET | Freq: Every day | ORAL | Status: DC
  Administered 2020-12-10 – 2020-12-13 (×4): 60 mg via ORAL
  Filled 2020-12-10 (×4): qty 3

## 2020-12-10 MED ORDER — BISACODYL 5 MG PO TBEC: 5.0000 mg | DELAYED_RELEASE_TABLET | Freq: Every day | ORAL | Status: DC | PRN

## 2020-12-10 MED ORDER — HYDROCODONE-ACETAMINOPHEN 5-325 MG PO TABS
1.0000 | ORAL_TABLET | Freq: Four times a day (QID) | ORAL | Status: DC | PRN
  Administered 2020-12-11: 1 via ORAL
  Administered 2020-12-11: 2 via ORAL
  Administered 2020-12-11: 1 via ORAL
  Administered 2020-12-12 – 2020-12-13 (×2): 2 via ORAL
  Filled 2020-12-10: qty 2
  Filled 2020-12-10 (×2): qty 1
  Filled 2020-12-10 (×2): qty 2

## 2020-12-10 MED ORDER — ACETAMINOPHEN 650 MG RE SUPP: 650.0000 mg | Freq: Four times a day (QID) | RECTAL | Status: DC | PRN

## 2020-12-10 MED ORDER — CLONIDINE HCL 0.1 MG PO TABS
0.1000 mg | ORAL_TABLET | Freq: Three times a day (TID) | ORAL | Status: DC | PRN
  Administered 2020-12-12: 0.1 mg via ORAL
  Filled 2020-12-10: qty 1

## 2020-12-10 MED ORDER — AMLODIPINE BESYLATE 5 MG PO TABS
5.0000 mg | ORAL_TABLET | Freq: Every day | ORAL | Status: DC
  Administered 2020-12-10: 5 mg via ORAL
  Filled 2020-12-10: qty 1

## 2020-12-10 MED ORDER — METHOCARBAMOL 1000 MG/10ML IJ SOLN
500.0000 mg | Freq: Four times a day (QID) | INTRAVENOUS | Status: DC | PRN
  Filled 2020-12-10: qty 5

## 2020-12-10 NOTE — Consult Note (Signed)
I have been asked to see the patient by Dr. Karmen Bongo, for evaluation and management of gross hematuria with acute urinary retention.  History of present illness: 65 year old man with recent hospitalization for AKI associated with ANCA vasculitis who underwent renal biopsy on 12/02/2020.  He now presents to the ED with gross hematuria and clot urinary retention.  The ED placed a 16 French three-way Foley catheter and began CBI.  There was noted to be 6 cm of dependent clot in patient's bladder however.  Urology was then consulted.  Patient reports gross hematuria for the past week.  He began having difficulty emptying his bladder as well as flank pain which prompted visit to ED.  He has never seen a Dealer before.     Review of systems: A 12 point comprehensive review of systems was obtained and is negative unless otherwise stated in the history of present illness.  Patient Active Problem List   Diagnosis Date Noted   Bladder outlet obstruction 12/10/2020   ANCA-associated vasculitis ([facility]) 12/01/2020   Essential hypertension 12/01/2020   AKI (acute kidney injury) ([place]) 11/15/2020    No current facility-administered medications on file prior to encounter.   Current Outpatient Medications on File Prior to Encounter  Medication Sig Dispense Refill   acetaminophen (TYLENOL) 500 MG tablet Take 500 mg by mouth every 6 (six) hours as needed for mild pain.     amLODipine (NORVASC) 5 MG tablet Take 1 tablet (5 mg total) by mouth daily. 30 tablet 1   ferrous sulfate 325 (65 FE) MG tablet Take 1 tablet (325 mg total) by mouth daily. 30 tablet 1   HYDROcodone-acetaminophen (NORCO/VICODIN) 5-325 MG tablet Take 1-2 tablets by mouth every 6 (six) hours as needed for moderate pain. 12 tablet 0   predniSONE (DELTASONE) 20 MG tablet Take 3 tablets (60 mg total) by mouth daily with breakfast. 90 tablet 0    Past Medical History:  Diagnosis Date   Hypertension    Renal disorder associated with  systemic disease     History reviewed. No pertinent surgical history.  Social History   Tobacco Use   Smoking status: Never   Smokeless tobacco: Never  Substance Use Topics   Alcohol use: Yes   Drug use: Not Currently    History reviewed. No pertinent family history.  PE: Vitals:   12/10/20 0320 12/10/20 0400 12/10/20 0440 12/10/20 0700  BP: (!) 166/89 (!) 172/85 (!) 175/82 (!) 167/74  Pulse: 70 72 73 71  Resp: 16 15 13 14   Temp:      TempSrc:      SpO2: 100% 100% 100% 100%  Weight:      Height:       Patient appears to be in no acute distress  patient is alert and oriented x3 Atraumatic normocephalic head No increased work of breathing, no audible wheezes/rhonchi Regular sinus rhythm/rate Abdomen is soft, nontender, nondistended Lower extremities are symmetric without appreciable edema Grossly neurologically intact No identifiable skin lesions  Recent Labs    12/10/20 0317  WBC 19.1*  HGB 9.1*  HCT 29.3*   Recent Labs    12/10/20 0317  NA 132*  K 4.4  CL 100  CO2 25  GLUCOSE 115*  BUN 42*  CREATININE 2.22*  CALCIUM 8.5*   No results for input(s): LABPT, INR in the last 72 hours. No results for input(s): LABURIN in the last 72 hours. Results for orders placed or performed during the hospital encounter of 12/10/20  Resp  Panel by RT-PCR (Flu A&B, Covid) Nasopharyngeal Swab     Status: None   Collection Time: 12/10/20  4:51 AM   Specimen: Nasopharyngeal Swab; Nasopharyngeal(NP) swabs in vial transport medium  Result Value Ref Range Status   SARS Coronavirus 2 by RT PCR NEGATIVE NEGATIVE Final    Comment: (NOTE) SARS-CoV-2 target nucleic acids are NOT DETECTED.  The SARS-CoV-2 RNA is generally detectable in upper respiratory specimens during the acute phase of infection. The lowest concentration of SARS-CoV-2 viral copies this assay can detect is 138 copies/mL. A negative result does not preclude SARS-Cov-2 infection and should not be used as the  sole basis for treatment or other patient management decisions. A negative result may occur with  improper specimen collection/handling, submission of specimen other than nasopharyngeal swab, presence of viral mutation(s) within the areas targeted by this assay, and inadequate number of viral copies(<138 copies/mL). A negative result must be combined with clinical observations, patient history, and epidemiological information. The expected result is Negative.  Fact Sheet for Patients:  EntrepreneurPulse.com.au  Fact Sheet for Healthcare Providers:  IncredibleEmployment.be  This test is no t yet approved or cleared by the [place] FDA and  has been authorized for detection and/or diagnosis of SARS-CoV-2 by FDA under an Emergency Use Authorization (EUA). This EUA will remain  in effect (meaning this test can be used) for the duration of the COVID-19 declaration under Section 564(b)(1) of the Act, 21 U.S.C.section 360bbb-3(b)(1), unless the authorization is terminated  or revoked sooner.       Influenza A by PCR NEGATIVE NEGATIVE Final   Influenza B by PCR NEGATIVE NEGATIVE Final    Comment: (NOTE) The Xpert Xpress SARS-CoV-2/FLU/RSV plus assay is intended as an aid in the diagnosis of influenza from Nasopharyngeal swab specimens and should not be used as a sole basis for treatment. Nasal washings and aspirates are unacceptable for Xpert Xpress SARS-CoV-2/FLU/RSV testing.  Fact Sheet for Patients: EntrepreneurPulse.com.au  Fact Sheet for Healthcare Providers: IncredibleEmployment.be  This test is not yet approved or cleared by the [place] FDA and has been authorized for detection and/or diagnosis of SARS-CoV-2 by FDA under an Emergency Use Authorization (EUA). This EUA will remain in effect (meaning this test can be used) for the duration of the COVID-19 declaration under Section 564(b)(1) of the  Act, 21 U.S.C. section 360bbb-3(b)(1), unless the authorization is terminated or revoked.  Performed at [facility], [place] [address], [place], [place] [address]     Imaging: CT Non-Contrast Abd/Pelvis 12/10/20 IMPRESSION: 6.4 cm dependently layering blood clot within the bladder lumen. Mild bladder distension suggests some degree of bladder outlet obstruction. Mild left hydronephrosis and moderate left perinephric stranding noted. No subcapsular hematoma or pararenal fluid collection or hematoma identified, however. Note that the presence of an underlying arteriovenous fistula, pseudoaneurysm, or extravasation into the collecting system itself is not well assessed in absence of contrast administration   Aortic Atherosclerosis (ICD10-I70.0).     Electronically Signed   By: Fidela Salisbury M.D.   On: 12/10/2020 04:29  Imp/Recommendations:  Clot urinary retention: Hematuria and clots from upper tract bleeding following renal biopsy.  Recommended that 16 French Foley catheter be removed and upsized to a 22 [place] three-way Foley catheter.  Do not start CBI until I am able to evaluate and irrigate patient's bladder.  **Update: I exchanged 16Fr 3-way and replaced with 24Fr 3-way foley using sterile technique.  1L NaCl was used to irrigate residual clot burden.   Continuous  bladder irrigation was restarted.  -BNO suppository, oxybutynin 5mg  TID PRN, and lidocaine jelly to tip of penis for pain control -Wean CBI as appropriate; void trial to be determined as hematuria clears   Thank you for involving me in this patient's care.  Please page with any further questions or concerns. Zendayah Hardgrave D Khaidyn Staebell

## 2020-12-10 NOTE — H&P (Signed)
History and Physical    Sergio Sims ZOX:[id number] DOB: 06/20/1955 DOA: 12/10/2020  PCP: Pieter Partridge, PA   Patient coming from: home  I have personally briefly reviewed patient's old medical records in [place] [facility]  Chief Complaint: Urinary retention  HPI: Sergio Sims is a 65 y.o. male with medical history significant of hypertension, ANCA+ vasculitis and underwent a left kidney biopsy on 815 now presents with dysuria complaining of hematuria passing clots with worsening left flank pain since yesterday.  Patient had a renal ultrasound at the discharge showed a large hematoma in the bladder at the time.  Patient's been prescribed hydrocodone which has not provided much relief.  Patient started ED anticoagulation.  Patient also endorses some constipation.  Patient has a white count of 19 from 14 but has been on steroids.  Noncontrasted brain CT showing 6.4 cm clot in the bladder with some mild bladder distention mild light left hydronephrosis with moderate left perinephric stranding and 3-week follow-up Foley catheter was placed with continuous irrigation in the ED.  Sodium 132 today glucose 115 creatinine 2.22 BUN 42 GFR 32.  Prior BUN of 56 creatinine 2.95 GFR 23.  Patient appears to have CKD 4.  Baseline H&H 2 weeks ago 8.4 and 26.4 today is 9.1 and 29.3.  Platelets are 359.  COVID and flu are negative.  UA showing red color 50 glucose moderate hemoglobin trace leuks no nitrites.  Zofran, IV fluids and prednisone ordered in the ED.  Patient also has as needed fentanyl and Dilaudid.  Patient left flank pain improved now 719 from 10 out of 10.  Patient wife at bedside Ms. Sergio Sims he will provide history as well.  Patient discussed with nurse at bedside.  She has never seen urologist before.  Patient's nephrologist is Dr. Osborne Casco.  Urology note reviewed Dr. Claudia Desanctis recommends to not starting CBI until she is able evaluate the patient and irrigate the patient's bladder.  Recommend 16 [place] removed  and upsized to 22 [place].  Initially patient declined removal until pain better controlled.  ED Course: As above  Review of Systems: As per HPI otherwise all other systems reviewed and are negative.   Past Medical History:  Diagnosis Date   Hypertension    Renal disorder associated with systemic disease     Past Surgical History:  Procedure Laterality Date   RENAL BIOPSY, PERCUTANEOUS Left 12/02/2020    Social History  reports that he has quit smoking. His smoking use included cigarettes. He has never used smokeless tobacco. He reports current alcohol use. He reports that he does not currently use drugs.  No Known Allergies  Family History  Problem Relation Age of Onset   Renal Disease Neg Hx    Autoimmune disease Neg Hx      Prior to Admission medications   Medication Sig Start Date End Date Taking? Authorizing Provider  acetaminophen (TYLENOL) 500 MG tablet Take 500 mg by mouth every 6 (six) hours as needed for mild pain.   Yes [provider]  amLODipine (NORVASC) 5 MG tablet Take 1 tablet (5 mg total) by mouth daily. 11/17/20  Yes [name], MD  ferrous sulfate 325 (65 FE) MG tablet Take 1 tablet (325 mg total) by mouth daily. 11/17/20 11/17/21 Yes [name], MD  HYDROcodone-acetaminophen (NORCO/VICODIN) 5-325 MG tablet Take 1-2 tablets by mouth every 6 (six) hours as needed for moderate pain. 12/03/20  Yes Danford, Suann Larry, MD  predniSONE (DELTASONE) 20 MG tablet Take 3 tablets (60  mg total) by mouth daily with breakfast. 12/04/20  Yes Edwin Dada, MD    Physical Exam: Vitals:   12/10/20 0400 12/10/20 0440 12/10/20 0700 12/10/20 0730  BP: (!) 172/85 (!) 175/82 (!) 167/74 (!) 176/92  Pulse: 72 73 71 70  Resp: 15 13 14 16   Temp:      TempSrc:      SpO2: 100% 100% 100% 100%  Weight:      Height:        Constitutional: NAD, calm, comfortable Vitals:   12/10/20 0400 12/10/20 0440 12/10/20 0700 12/10/20 0730  BP: (!) 172/85 (!)  175/82 (!) 167/74 (!) 176/92  Pulse: 72 73 71 70  Resp: 15 13 14 16   Temp:      TempSrc:      SpO2: 100% 100% 100% 100%  Weight:      Height:       Eyes: PERRL, lids and conjunctivae normal ENMT: Mucous membranes are moist. Posterior pharynx clear of any exudate or lesions.Normal dentition.  Neck: normal, supple, no masses, no thyromegaly Respiratory: clear to auscultation bilaterally, no wheezing, no crackles. Normal respiratory effort. No accessory muscle use.  Cardiovascular: Regular rate and rhythm, no murmurs / rubs / gallops. No extremity edema. 2+ pedal pulses. No carotid bruits.  Abdomen: suprapubic tenderness no masses palpated. No hepatosplenomegaly. Bowel sounds positive.  Musculoskeletal: no clubbing / cyanosis. No joint deformity upper and lower extremities. Good ROM, no contractures. Normal muscle tone. Left flank nontender to palpation Skin: no rashes, lesions, ulcers. No induration Neurologic: CN 2-12 grossly intact. Sensation intact, DTR normal. Strength 5/5 in all 4.  Psychiatric: Normal judgment and insight. Alert and oriented x 3. Normal mood GU: cath in place  Labs on Admission: I have personally reviewed following labs and imaging studies  CBC: Recent Labs  Lab 12/03/20 1432 12/10/20 0317  WBC 14.1* 19.1*  NEUTROABS  --  16.2*  HGB 8.4* 9.1*  HCT 26.4* 29.3*  MCV 86.6 87.7  PLT 377 185    Basic Metabolic Panel: Recent Labs  Lab 12/10/20 0317  NA 132*  K 4.4  CL 100  CO2 25  GLUCOSE 115*  BUN 42*  CREATININE 2.22*  CALCIUM 8.5*    GFR: Estimated Creatinine Clearance: 38.5 mL/min (A) (by C-G formula based on SCr of 2.22 mg/dL (H)).  Liver Function Tests: No results for input(s): AST, ALT, ALKPHOS, BILITOT, PROT, ALBUMIN in the last 168 hours.  Urine analysis:    Component Value Date/Time   COLORURINE RED (A) 12/10/2020 0611   APPEARANCEUR HAZY (A) 12/10/2020 0611   LABSPEC 1.010 12/10/2020 0611   PHURINE 6.0 12/10/2020 0611   GLUCOSEU  50 (A) 12/10/2020 0611   HGBUR MODERATE (A) 12/10/2020 0611   BILIRUBINUR NEGATIVE 12/10/2020 0611   KETONESUR NEGATIVE 12/10/2020 0611   PROTEINUR 100 (A) 12/10/2020 0611   NITRITE NEGATIVE 12/10/2020 0611   LEUKOCYTESUR TRACE (A) 12/10/2020 0611    Radiological Exams on Admission: CT Renal Stone Study  Result Date: 12/10/2020 CLINICAL DATA:  Hematuria following left kidney biopsy. Dysuria with passage of clots. Large bladder clot noted on bladder sonogram. Leukocytosis EXAM: CT ABDOMEN AND PELVIS WITHOUT CONTRAST TECHNIQUE: Multidetector CT imaging of the abdomen and pelvis was performed following the standard protocol without IV contrast. COMPARISON:  None. FINDINGS: Lower chest: Right basilar pleural thickening and associated posterobasal right lower lobe parenchymal scarring noted. The visualized heart and pericardium are unremarkable. Hepatobiliary: No focal liver abnormality is seen. No gallstones, gallbladder wall thickening,  or biliary dilatation. Pancreas: Unremarkable Spleen: Unremarkable Adrenals/Urinary Tract: The adrenal glands are unremarkable. The kidneys are normal in size and position. There is mild left hydronephrosis and moderate left perinephric stranding. No subcapsular hematoma or perirenal fluid collection is identified. Minimal nonspecific right perinephric stranding noted. Exophytic simple cortical cyst arises from the lower pole of the right kidney. No nephro or urolithiasis. The bladder is distended with a dependently layering blood clot measuring 6.4 x 4.6 x 6.3 cm. Stomach/Bowel: Stomach is within normal limits. Appendix appears normal. No evidence of bowel wall thickening, distention, or inflammatory changes. No free intraperitoneal gas or fluid. Vascular/Lymphatic: Moderate aortoiliac atherosclerotic calcification. No aortic aneurysm. No pathologic adenopathy within the abdomen and pelvis. Reproductive: Prostate is unremarkable. Other: Tiny bilateral fat containing  inguinal hernias. Rectum unremarkable. Musculoskeletal: Degenerative changes are seen within the lumbar spine. No acute bone abnormality. No lytic or blastic bone lesion. IMPRESSION: 6.4 cm dependently layering blood clot within the bladder lumen. Mild bladder distension suggests some degree of bladder outlet obstruction. Mild left hydronephrosis and moderate left perinephric stranding noted. No subcapsular hematoma or pararenal fluid collection or hematoma identified, however. Note that the presence of an underlying arteriovenous fistula, pseudoaneurysm, or extravasation into the collecting system itself is not well assessed in absence of contrast administration Aortic Atherosclerosis (ICD10-I70.0). Electronically Signed   By: Fidela Salisbury M.D.   On: 12/10/2020 04:29      Assessment/Plan Principal Problem:   Bladder outlet obstruction Active Problems:   ANCA-associated vasculitis ([facility])   Essential hypertension   Hematuria   Nausea in adult   Hyponatremia  65 year old male with a past medical history of ANCA vasculitis status post kidney biopsy on 815 presents with dysuria, flank pain and hematuria.  CT showing blood clot in the bladder that 6.5 and mild bladder distention, mild left hydronephrosis and mild left perinephric stranding.  Patient has three-way catheter placed for irrigation. Follow-up urology consult Will hold antibiotics until recommended by urology As needed Tylenol, hydrocodone for pain. Continue home prednisone  Nausea-as needed Zofran  Iron deficiency anemia-continue ferrous sulfate  Hypertension-continue home Norvasc and will give as needed hydralazine  Hyponatremia-mild given normal saline, follow morning labs  Disposition-admit patient, telemetry, continue to monitor Npo until seen by urology  DVT prophylaxis: scd Code Status:   full Family Communication:  na Disposition Plan:   Patient is from:  home  Anticipated DC to:  home  Anticipated DC  date:  tbd  Anticipated DC barriers: hematuria  Consults called:  Already in place-Dr. Antonietta Breach Admission status:  inpt  Severity of Illness: The appropriate patient status for this patient is INPATIENT. Inpatient status is judged to be reasonable and necessary in order to provide the required intensity of service to ensure the patient's safety. The patient's presenting symptoms, physical exam findings, and initial radiographic and laboratory data in the context of their chronic comorbidities is felt to place them at high risk for further clinical deterioration. Furthermore, it is not anticipated that the patient will be medically stable for discharge from the hospital within 2 midnights of admission. The following factors support the patient status of inpatient.   " The patient's presenting symptoms include as above. " The worrisome physical exam findings include as above. " The initial radiographic and laboratory data are worrisome because of as above. " The chronic co-morbidities include as above.   * I certify that at the point of admission it is my clinical judgment that the patient will require inpatient  hospital care spanning beyond 2 midnights from the point of admission due to high intensity of service, high risk for further deterioration and high frequency of surveillance required.Jacqlyn Krauss MD [facility] [phone] How to contact the [facility] Attending or Consulting provider [place] or covering provider during after hours [place], for this patient?   Check the care team in [facility] and look for a) attending/consulting [facility] provider listed and b) the [facility] team listed Log into [email or web address] and use [place]'s universal password to access. If you do not have the password, please contact the hospital operator. Locate the [facility] provider you are looking for under [facility] and page to a number that you can be directly reached. If you still have difficulty reaching  the provider, please page the [facility] (Director on Call) for the Hospitalists listed on amion for assistance.  12/10/2020, 9:39 AM

## 2020-12-10 NOTE — ED Notes (Signed)
Foley bag emptied

## 2020-12-10 NOTE — ED Notes (Signed)
CBI paused

## 2020-12-10 NOTE — ED Provider Notes (Signed)
[facility] DEPT Provider Note   CSN: [id number] Arrival date & time: 12/10/20  0107     History Chief Complaint  Patient presents with   Flank Pain   Hematuria    Sergio Sims is a 65 y.o. male.  Sergio Sims is a 65 y/o male with PMH of ANCA+ vasculitis s/p L kidney biopsy on 8/15. Since the procedure patient complains of painful urination, hematuria passing blood and clots, with worsening left sided flank pain since yesterday. Renal US after discharge showed large hematoma in bladder. He has tried taking his prescribed hydrocodone without relief. Has nephrology follow up scheduled for tomorrow. Denies fever, chills, nausea, vomiting. Does not take blood thinners.   Flank Pain Pertinent negatives include no abdominal pain.  Hematuria Pertinent negatives include no abdominal pain.      Past Medical History:  Diagnosis Date   Hypertension    Renal disorder associated with systemic disease     Patient Active Problem List   Diagnosis Date Noted   Bladder outlet obstruction 12/10/2020   ANCA-associated vasculitis ([place]) 12/01/2020   Essential hypertension 12/01/2020   AKI (acute kidney injury) ([place]) 11/15/2020    History reviewed. No pertinent surgical history.     History reviewed. No pertinent family history.  Social History   Tobacco Use   Smoking status: Never   Smokeless tobacco: Never  Substance Use Topics   Alcohol use: Yes   Drug use: Not Currently    Home Medications Prior to Admission medications   Medication Sig Start Date End Date Taking? Authorizing Provider  acetaminophen (TYLENOL) 500 MG tablet Take 500 mg by mouth every 6 (six) hours as needed for mild pain.   Yes [provider]  amLODipine (NORVASC) 5 MG tablet Take 1 tablet (5 mg total) by mouth daily. 11/17/20  Yes [name], MD  ferrous sulfate 325 (65 FE) MG tablet Take 1 tablet (325 mg total) by mouth daily. 11/17/20 11/17/21 Yes [name], MD  HYDROcodone-acetaminophen (NORCO/VICODIN) 5-325 MG tablet Take 1-2 tablets by mouth every 6 (six) hours as needed for moderate pain. 12/03/20  Yes Danford, Suann Larry, MD  predniSONE (DELTASONE) 20 MG tablet Take 3 tablets (60 mg total) by mouth daily with breakfast. 12/04/20  Yes Danford, Suann Larry, MD    Allergies    Patient has no known allergies.  Review of Systems   Review of Systems  Constitutional:  Negative for chills and fever.  Gastrointestinal:  Positive for constipation. Negative for abdominal pain, diarrhea, nausea and vomiting.  Genitourinary:  Positive for difficulty urinating, dysuria, flank pain and hematuria.  All other systems reviewed and are negative.  Physical Exam Updated Vital Signs BP (!) 175/82   Pulse 73   Temp 98.3 F (36.8 C) (Oral)   Resp 13   Ht 6' (1.829 m)   Wt 94.3 kg   SpO2 100%   BMI 28.21 kg/m   Physical Exam Vitals and nursing note reviewed.  Constitutional:      Appearance: Normal appearance.  HENT:     Head: Normocephalic and atraumatic.  Eyes:     Conjunctiva/sclera: Conjunctivae normal.  Cardiovascular:     Rate and Rhythm: Normal rate and regular rhythm.  Pulmonary:     Effort: Pulmonary effort is normal. No respiratory distress.     Breath sounds: Normal breath sounds.  Abdominal:     General: There is no distension.     Palpations: Abdomen is soft.  Tenderness: There is no abdominal tenderness. There is no right CVA tenderness or left CVA tenderness.  Skin:    General: Skin is warm and dry.  Neurological:     General: No focal deficit present.     Mental Status: He is alert and oriented to person, place, and time.    ED Results / Procedures / Treatments   Labs (all labs ordered are listed, but only abnormal results are displayed) Labs Reviewed  CBC WITH DIFFERENTIAL/PLATELET - Abnormal; Notable for the following components:      Result Value   WBC 19.1 (*)    RBC 3.34 (*)    Hemoglobin 9.1 (*)     HCT 29.3 (*)    RDW 17.8 (*)    Neutro Abs 16.2 (*)    Monocytes Absolute 1.3 (*)    Abs Immature Granulocytes 0.31 (*)    All other components within normal limits  BASIC METABOLIC PANEL - Abnormal; Notable for the following components:   Sodium 132 (*)    Glucose, Bld 115 (*)    BUN 42 (*)    Creatinine, Ser 2.22 (*)    Calcium 8.5 (*)    GFR, Estimated 32 (*)    All other components within normal limits  RESP PANEL BY RT-PCR (FLU A&B, COVID) [id number]  URINALYSIS, ROUTINE W REFLEX MICROSCOPIC    EKG None  Radiology CT Renal Stone Study  Result Date: 12/10/2020 CLINICAL DATA:  Hematuria following left kidney biopsy. Dysuria with passage of clots. Large bladder clot noted on bladder sonogram. Leukocytosis EXAM: CT ABDOMEN AND PELVIS WITHOUT CONTRAST TECHNIQUE: Multidetector CT imaging of the abdomen and pelvis was performed following the standard protocol without IV contrast. COMPARISON:  None. FINDINGS: Lower chest: Right basilar pleural thickening and associated posterobasal right lower lobe parenchymal scarring noted. The visualized heart and pericardium are unremarkable. Hepatobiliary: No focal liver abnormality is seen. No gallstones, gallbladder wall thickening, or biliary dilatation. Pancreas: Unremarkable Spleen: Unremarkable Adrenals/Urinary Tract: The adrenal glands are unremarkable. The kidneys are normal in size and position. There is mild left hydronephrosis and moderate left perinephric stranding. No subcapsular hematoma or perirenal fluid collection is identified. Minimal nonspecific right perinephric stranding noted. Exophytic simple cortical cyst arises from the lower pole of the right kidney. No nephro or urolithiasis. The bladder is distended with a dependently layering blood clot measuring 6.4 x 4.6 x 6.3 cm. Stomach/Bowel: Stomach is within normal limits. Appendix appears normal. No evidence of bowel wall thickening, distention, or inflammatory changes. No free  intraperitoneal gas or fluid. Vascular/Lymphatic: Moderate aortoiliac atherosclerotic calcification. No aortic aneurysm. No pathologic adenopathy within the abdomen and pelvis. Reproductive: Prostate is unremarkable. Other: Tiny bilateral fat containing inguinal hernias. Rectum unremarkable. Musculoskeletal: Degenerative changes are seen within the lumbar spine. No acute bone abnormality. No lytic or blastic bone lesion. IMPRESSION: 6.4 cm dependently layering blood clot within the bladder lumen. Mild bladder distension suggests some degree of bladder outlet obstruction. Mild left hydronephrosis and moderate left perinephric stranding noted. No subcapsular hematoma or pararenal fluid collection or hematoma identified, however. Note that the presence of an underlying arteriovenous fistula, pseudoaneurysm, or extravasation into the collecting system itself is not well assessed in absence of contrast administration Aortic Atherosclerosis (ICD10-I70.0). Electronically Signed   By: Fidela Salisbury M.D.   On: 12/10/2020 04:29    Procedures Procedures   Medications Ordered in ED Medications  sodium chloride irrigation 0.9 % 3,000 mL (3,000 mLs Irrigation New Bag/Given 12/10/20 0618)  morphine 4 MG/ML  injection 4 mg (has no administration in time range)  morphine 4 MG/ML injection 4 mg (4 mg Intravenous Given 12/10/20 1275)    ED Course  I have reviewed the triage vital signs and the nursing notes.  Pertinent labs & imaging results that were available during my care of the patient were reviewed by me and considered in my medical decision making (see chart for details).    MDM Rules/Calculators/A&P                           Patient is 65 y/o male s/p L kidney biopsy for ANCA-related vasculitis on 8/15 who presents with persistent hematuria and L sided flank pain. CBC shows leukocytosis 19.1 in setting of steroid use but concern for infection. Hgb improved 9.1 compared to prior, creatinine improved 2.22  compared to prior.  Non-con CT renal showed 6.4 cm blood clot in bladder with mild bladder distention, mild left hydronephrosis with moderate left perinephric stranding. 3 way foley catheter with continuous irrigation.  Consult to hospitalist, accepted admit for bladder obstruction and vasculitis. Will consult urology to follow patient while admitted.  Final Clinical Impression(s) / ED Diagnoses Final diagnoses:  Bladder outlet obstruction  Vasculitis [facility])    Rx / DC Orders ED Discharge Orders     None        Estill Cotta 12/10/20 0623    Palumbo, April, MD 12/10/20 [phone]

## 2020-12-10 NOTE — ED Triage Notes (Signed)
Pt reports having a left kidney biopsy last week and is now having painful urination and passing blood/clots since yesterday evening. Pt complains of pain 8/10.

## 2020-12-10 NOTE — Consult Note (Signed)
Renal Service Consult Note [facility]  Sergio Sims 12/10/2020 [name], MD Requesting Physician: Dr. Dwyane Dee  Reason for Consult: CKD pt w/ hematuria after recent renal biopsy HPI: The patient is a 65 y.o. year-old w/ hx of HTN and recent onset renal failure w/ work-up + for C- ANCA titers on blood testing.  Had recent renal biopsy and 3d of bolus IV steroids. Had some post biopsy gross hematuria and dc'd on 8/16 w/ Hb 8.4. Came to ED yest w/ Hb 9.1 and still having problems w/ gross hematuria, passing clots, difficulty voiding/ emptying bladder.  In ED foley was placed and urology consulted, they placed larger catheter and irrigated the bladder clots and now pt is getting CBI and feeling a lot better.  L flank pain easing off. [place] did show L hydro initially. Creat here is 2.2 , stable from prior. Pt feeling better (more energy, appetite) since bolus IV steroids 1 week ago. Asked to see for renal failure.    Pt is f/b Dr Joylene Grapes at [facility].  Pt now is feeling better, L flank pain is better and no SOB, cough, fevers.     ROS - denies CP, no joint pain, no HA, no blurry vision, no rash, no diarrhea, no nausea/ vomiting, no dysuria, no difficulty voiding   Past Medical History  Past Medical History:  Diagnosis Date   Hypertension    Renal disorder associated with systemic disease    Past Surgical History  Past Surgical History:  Procedure Laterality Date   RENAL BIOPSY, PERCUTANEOUS Left 12/02/2020   Family History  Family History  Problem Relation Age of Onset   Renal Disease Neg Hx    Autoimmune disease Neg Hx    Social History  reports that he has quit smoking. His smoking use included cigarettes. He has never used smokeless tobacco. He reports current alcohol use. He reports that he does not currently use drugs. Allergies No Known Allergies Home medications Prior to Admission medications   Medication Sig Start Date End Date Taking? Authorizing Provider   acetaminophen (TYLENOL) 500 MG tablet Take 500 mg by mouth every 6 (six) hours as needed for mild pain.   Yes [provider]  amLODipine (NORVASC) 5 MG tablet Take 1 tablet (5 mg total) by mouth daily. 11/17/20  Yes [name], MD  ferrous sulfate 325 (65 FE) MG tablet Take 1 tablet (325 mg total) by mouth daily. 11/17/20 11/17/21 Yes [name], MD  HYDROcodone-acetaminophen (NORCO/VICODIN) 5-325 MG tablet Take 1-2 tablets by mouth every 6 (six) hours as needed for moderate pain. 12/03/20  Yes Danford, Suann Larry, MD  predniSONE (DELTASONE) 20 MG tablet Take 3 tablets (60 mg total) by mouth daily with breakfast. 12/04/20  Yes Danford, Suann Larry, MD     Vitals:   12/10/20 1230 12/10/20 1300 12/10/20 1330 12/10/20 1400  BP: (!) 191/78 (!) 152/85 (!) 163/80 (!) 147/82  Pulse: 62 64 67 65  Resp: 15 13 14 10   Temp:    99 F (37.2 C)  TempSrc:      SpO2: 100% 99% 100% 97%  Weight:      Height:       Exam Gen alert, no distress No rash, cyanosis or gangrene Sclera anicteric, throat clear  No jvd or bruits Chest clear bilat to bases, no rales/ wheezing RRR no MRG Abd soft ntnd no mass or ascites +bs GU normal male w/ large bore CBI foley in place w/ irrigation running Urine light  pink MS no joint effusions or deformity Ext no LE or UE edema, no wounds or ulcers Neuro is alert, Ox 3 , nf         Home meds include norvasc 5 qd/ norco prn/ prednisone 60mg  po daily     Hb 9.1  WBC 18k   Na 132  Cr 2.2 BUN 42  CO2 25    Assessment/ Plan: Gross hematuria - sp recent renal biopsy on 8/15. Seen by urology and CBI running, pt feeling better.  Appreciate assistance.  Hb 9's.  Glomerulonephritis - ANCA+ w/ recent biopsy, f/b Dr Joylene Grapes. Creat stable 2.2. We have started him on po PPI and bactrim prophylaxis for possible Rituxan induction while here in hospital.  SP bolus steroids last week 500 mg x 3 and is getting po pred 60 qd.  HTN - bp's up, will ^norvasc to  5 bid      Kelly Splinter  MD 12/10/2020, 2:29 PM  Recent Labs  Lab 12/10/20 0317 12/10/20 0932  WBC 19.1* 18.7*  HGB 9.1* 9.1*   Recent Labs  Lab 12/10/20 0317  K 4.4  BUN 42*  CREATININE 2.22*  CALCIUM 8.5*

## 2020-12-10 NOTE — ED Notes (Signed)
Treatment team contacted for pain medications. Patient was ordered morphine. Went in room to administer medications patient states, "let me stop you right there. I was just told that morphine was bad for my kidneys. I just had some lady tell me that I was going to get Dilaudid. I just want to make sure we are all on the same page." Advised patient I understood his concerns and will follow up with MD team.

## 2020-12-10 NOTE — H&P (Signed)
History and Physical    Sergio Sims WRU:[id number] DOB: Mar 28, 1956 DOA: 12/10/2020  PCP: Pieter Partridge, PA Consultants:  Dan Europe - pulmonology; Zane Herald - Nephrology Patient coming from:  Home - lives with wife; NOK: Wife, [phone]  Chief Complaint: hematuria post-procedure  HPI: Sergio Sims is a 65 y.o. male with medical history significant of HTN and recent admission for AKI associated with ANCA vasculitis.   He was last hospitalized from 8/14-16 for renal biopsy with resultant gross hematuria which reportedly cleared over the following 24 hours (patient adamantly reports that it never cleared).  He was discharged on prednisone with plan for outpatient nephrology f/u.  He reports that the blood in his urine never went away.  The kidney they did the biopsy in started hurting last night and so he came in.  He has been passing clots all week.  He feels like the issues are associated with the biopsy - he felt pain as soon as it happened and he "hasn't been right since."  He has been having blood and clots since then.      ED Course: Carryover, per Dr. Marlowe Sax:  Patient with history of hypertension, CKD related to ANCA vasculitis recently underwent renal biopsy on 8/15 and since then having hematuria but now stopped urinating.  Imaging showing blood clot in the bladder causing outlet obstruction with resulting mild left-sided hydronephrosis.  Creatinine 2.2, stable since recent labs.  Hemoglobin stable.  Foley placed in the ED and undergoing continuous bladder irrigation.  ED physician will consult urology.  Consider consulting IR in the morning for embolization.  Review of Systems: As per HPI; otherwise review of systems reviewed and negative.   Ambulatory Status:  Ambulates without assistance  COVID Vaccine Status:  Complete  Past Medical History:  Diagnosis Date   Hypertension    Renal disorder associated with systemic disease     Past Surgical History:  Procedure  Laterality Date   RENAL BIOPSY, PERCUTANEOUS Left 12/02/2020    Social History   Socioeconomic History   Marital status: Married    Spouse name: Not on file   Number of children: Not on file   Years of education: Not on file   Highest education level: Not on file  Occupational History   Occupation: hvac  Tobacco Use   Smoking status: Former    Years: 5.00    Types: Cigarettes   Smokeless tobacco: Never  Substance and Sexual Activity   Alcohol use: Yes    Comment: varies depending on environment, denies having a problem with alcohol   Drug use: Not Currently    Comment: remote h/o marijuana   Sexual activity: Not on file  Other Topics Concern   Not on file  Social History Narrative   Not on file   Social Determinants of Health   Financial Resource Strain: Not on file  Food Insecurity: Not on file  Transportation Needs: Not on file  Physical Activity: Not on file  Stress: Not on file  Social Connections: Not on file  Intimate Partner Violence: Not on file    No Known Allergies  Family History  Problem Relation Age of Onset   Renal Disease Neg Hx    Autoimmune disease Neg Hx     Prior to Admission medications   Medication Sig Start Date End Date Taking? Authorizing Provider  acetaminophen (TYLENOL) 500 MG tablet Take 500 mg by mouth every 6 (six) hours as needed for mild pain.   Yes [provider]  amLODipine (NORVASC) 5 MG tablet Take 1 tablet (5 mg total) by mouth daily. 11/17/20  Yes [name], MD  ferrous sulfate 325 (65 FE) MG tablet Take 1 tablet (325 mg total) by mouth daily. 11/17/20 11/17/21 Yes [name], MD  HYDROcodone-acetaminophen (NORCO/VICODIN) 5-325 MG tablet Take 1-2 tablets by mouth every 6 (six) hours as needed for moderate pain. 12/03/20  Yes Danford, Suann Larry, MD  predniSONE (DELTASONE) 20 MG tablet Take 3 tablets (60 mg total) by mouth daily with breakfast. 12/04/20  Yes Danford, Suann Larry, MD    Physical  Exam: Vitals:   12/10/20 0400 12/10/20 0440 12/10/20 0700 12/10/20 0730  BP: (!) 172/85 (!) 175/82 (!) 167/74 (!) 176/92  Pulse: 72 73 71 70  Resp: 15 13 14 16   Temp:      TempSrc:      SpO2: 100% 100% 100% 100%  Weight:      Height:         General:  Appears calm and comfortable and is in NAD, mildly cantankerous Eyes:  EOMI, normal lids, iris ENT:  grossly normal hearing, lips & tongue, mmm; appropriate dentition Neck:  no LAD, masses or thyromegaly Cardiovascular:  RRR, no m/r/g. No LE edema.  Respiratory:   CTA bilaterally with no wheezes/rales/rhonchi.  Normal respiratory effort. Abdomen:  soft, NT, ND, no suprapubic TTP Back:   unable to examine due to CBI connections Skin:  no rash or induration seen on limited exam Musculoskeletal:  grossly normal tone BUE/BLE, good ROM, no bony abnormality Psychiatric:  grossly normal (cantankerous) mood and affect, speech fluent and appropriate, AOx3 Neurologic:  CN 2-12 grossly intact, moves all extremities in coordinated fashion    Radiological Exams on Admission: Independently reviewed - see discussion in A/P where applicable  CT Renal Stone Study  Result Date: 12/10/2020 CLINICAL DATA:  Hematuria following left kidney biopsy. Dysuria with passage of clots. Large bladder clot noted on bladder sonogram. Leukocytosis EXAM: CT ABDOMEN AND PELVIS WITHOUT CONTRAST TECHNIQUE: Multidetector CT imaging of the abdomen and pelvis was performed following the standard protocol without IV contrast. COMPARISON:  None. FINDINGS: Lower chest: Right basilar pleural thickening and associated posterobasal right lower lobe parenchymal scarring noted. The visualized heart and pericardium are unremarkable. Hepatobiliary: No focal liver abnormality is seen. No gallstones, gallbladder wall thickening, or biliary dilatation. Pancreas: Unremarkable Spleen: Unremarkable Adrenals/Urinary Tract: The adrenal glands are unremarkable. The kidneys are normal in size  and position. There is mild left hydronephrosis and moderate left perinephric stranding. No subcapsular hematoma or perirenal fluid collection is identified. Minimal nonspecific right perinephric stranding noted. Exophytic simple cortical cyst arises from the lower pole of the right kidney. No nephro or urolithiasis. The bladder is distended with a dependently layering blood clot measuring 6.4 x 4.6 x 6.3 cm. Stomach/Bowel: Stomach is within normal limits. Appendix appears normal. No evidence of bowel wall thickening, distention, or inflammatory changes. No free intraperitoneal gas or fluid. Vascular/Lymphatic: Moderate aortoiliac atherosclerotic calcification. No aortic aneurysm. No pathologic adenopathy within the abdomen and pelvis. Reproductive: Prostate is unremarkable. Other: Tiny bilateral fat containing inguinal hernias. Rectum unremarkable. Musculoskeletal: Degenerative changes are seen within the lumbar spine. No acute bone abnormality. No lytic or blastic bone lesion. IMPRESSION: 6.4 cm dependently layering blood clot within the bladder lumen. Mild bladder distension suggests some degree of bladder outlet obstruction. Mild left hydronephrosis and moderate left perinephric stranding noted. No subcapsular hematoma or pararenal fluid collection or hematoma identified, however. Note that the presence  of an underlying arteriovenous fistula, pseudoaneurysm, or extravasation into the collecting system itself is not well assessed in absence of contrast administration Aortic Atherosclerosis (ICD10-I70.0). Electronically Signed   By: Fidela Salisbury M.D.   On: 12/10/2020 04:29    EKG: not done   Labs on Admission: I have personally reviewed the available labs and imaging studies at the time of the admission.  Pertinent labs:   Na++ 132 Glucose 115 BUN 42/Creatinine 2.22/GFR 32 WBC 19.1 Hgb 9.1 COVID/flu negative UA: 50 glucose, moderate Hgb, trace LE, 100 protein, many  bacteria   Assessment/Plan Principal Problem:   Bladder outlet obstruction Active Problems:   ANCA-associated vasculitis ([facility])   Essential hypertension    Bladder outlet obstruction -Patient with recent renal biopsy and apparent persistent bleeding presenting with flank pain -Imaging shows persistent clots in the bladder lumen with bladder distention suggestive of bladder outlet obstruction as well as mild L hydronephrosis -Urology has been consulted and is planning to exchange the current foley to allow for clot evacuation -He will likely need CBI following the catheter exchange -Will admit to Med Surg  ANCA-associated vasculitis -Renal biopsy is still pending but this is his presumed diagnosis -Patient with recent issues with pulmonary nodules, weight loss, fatigue, vision/hearing disturbance -He was also previously admitted for AKI in the setting of known HTN (on ACE) -He appears to be steroid-responsive, as his symptoms wax and wane based on prednisone dosing - and his eye symptoms improved with Pred Forte, as well; will continue 60 mg prednisone daily for now -Nephrology will consult -Will continue Norvasc for HTN for now      Note: This patient has been tested and is negative for the novel coronavirus COVID-19. The patient has been fully vaccinated against COVID-19.   Level of care: Med-Surg DVT prophylaxis: SCDs Code Status:  Full - confirmed with patient Family Communication: None present Disposition Plan:  The patient is from: home  Anticipated d/c is to: home without [facility] services   Anticipated d/c date will depend on clinical response to treatment, likely 2-3 days  Patient is currently: acutely ill Consults called: Urology; nephrology; [place] team  Admission status:  Admit - It is my clinical opinion that admission to INPATIENT is reasonable and necessary because of the expectation that this patient will require hospital care that crosses at least 2 midnights to treat  this condition based on the medical complexity of the problems presented.  Given the aforementioned information, the predictability of an adverse outcome is felt to be significant.    Karmen Bongo MD [facility]   How to contact the [facility] Attending or Consulting provider [place] or covering provider during after hours [place], for this patient?  Check the care team in [facility] and look for a) attending/consulting [facility] provider listed and b) the [facility] team listed Log into [email or web address] and use [place]'s universal password to access. If you do not have the password, please contact the hospital operator. Locate the [facility] provider you are looking for under [facility] and page to a number that you can be directly reached. If you still have difficulty reaching the provider, please page the [facility] (Director on Call) for the Hospitalists listed on amion for assistance.   12/10/2020, 11:00 AM

## 2020-12-10 NOTE — ED Notes (Signed)
Patient placed on 2L [place] after having pain medication

## 2020-12-11 ENCOUNTER — Encounter (HOSPITAL_COMMUNITY): Payer: Self-pay | Admitting: Internal Medicine

## 2020-12-11 DIAGNOSIS — N320 Bladder-neck obstruction: Secondary | ICD-10-CM | POA: Diagnosis not present

## 2020-12-11 LAB — CBC
HCT: 25.9 % — ABNORMAL LOW (ref 39.0–52.0)
Hemoglobin: 8.2 g/dL — ABNORMAL LOW (ref 13.0–17.0)
MCH: 28.1 pg (ref 26.0–34.0)
MCHC: 31.7 g/dL (ref 30.0–36.0)
MCV: 88.7 fL (ref 80.0–100.0)
Platelets: 290 10*3/uL (ref 150–400)
RBC: 2.92 MIL/uL — ABNORMAL LOW (ref 4.22–5.81)
RDW: 18.2 % — ABNORMAL HIGH (ref 11.5–15.5)
WBC: 16.3 10*3/uL — ABNORMAL HIGH (ref 4.0–10.5)
nRBC: 0 % (ref 0.0–0.2)

## 2020-12-11 LAB — BASIC METABOLIC PANEL
Anion gap: 8 (ref 5–15)
BUN: 44 mg/dL — ABNORMAL HIGH (ref 8–23)
CO2: 24 mmol/L (ref 22–32)
Calcium: 8.3 mg/dL — ABNORMAL LOW (ref 8.9–10.3)
Chloride: 104 mmol/L (ref 98–111)
Creatinine, Ser: 2.65 mg/dL — ABNORMAL HIGH (ref 0.61–1.24)
GFR, Estimated: 26 mL/min — ABNORMAL LOW (ref >60)
Glucose, Bld: 114 mg/dL — ABNORMAL HIGH (ref 70–99)
Potassium: 4.4 mmol/L (ref 3.5–5.1)
Sodium: 136 mmol/L (ref 135–145)

## 2020-12-11 LAB — HEMOGLOBIN AND HEMATOCRIT, BLOOD
HCT: 27.3 % — ABNORMAL LOW (ref 39.0–52.0)
Hemoglobin: 8.7 g/dL — ABNORMAL LOW (ref 13.0–17.0)

## 2020-12-11 MED ORDER — DIPHENHYDRAMINE HCL 50 MG/ML IJ SOLN: 50.0000 mg | Freq: Once | INTRAMUSCULAR | Status: DC | PRN

## 2020-12-11 MED ORDER — ACETAMINOPHEN 325 MG PO TABS
650.0000 mg | ORAL_TABLET | Freq: Once | ORAL | Status: AC
  Administered 2020-12-12: 650 mg via ORAL
  Filled 2020-12-11: qty 2

## 2020-12-11 MED ORDER — FAMOTIDINE IN NACL 20-0.9 MG/50ML-% IV SOLN: 20.0000 mg | Freq: Once | INTRAVENOUS | Status: DC | PRN

## 2020-12-11 MED ORDER — METHYLPREDNISOLONE SODIUM SUCC 125 MG IJ SOLR: 125.0000 mg | Freq: Once | INTRAMUSCULAR | Status: DC | PRN

## 2020-12-11 MED ORDER — ALBUTEROL SULFATE (2.5 MG/3ML) 0.083% IN NEBU: 2.5000 mg | INHALATION_SOLUTION | Freq: Once | RESPIRATORY_TRACT | Status: DC | PRN

## 2020-12-11 MED ORDER — SODIUM CHLORIDE 0.9 % IV SOLN
1000.0000 mg | Freq: Once | INTRAVENOUS | Status: AC
  Administered 2020-12-12: 1000 mg via INTRAVENOUS
  Filled 2020-12-11: qty 100

## 2020-12-11 MED ORDER — EPINEPHRINE PF 1 MG/ML IJ SOLN
0.3000 mg | INTRAMUSCULAR | Status: DC | PRN
  Filled 2020-12-11: qty 1

## 2020-12-11 MED ORDER — SODIUM CHLORIDE 0.9 % IV BOLUS: 1000.0000 mL | Freq: Once | INTRAVENOUS | Status: DC | PRN

## 2020-12-11 MED ORDER — DIPHENHYDRAMINE HCL 50 MG PO CAPS
50.0000 mg | ORAL_CAPSULE | Freq: Once | ORAL | Status: AC
  Administered 2020-12-12: 50 mg via ORAL
  Filled 2020-12-11: qty 1

## 2020-12-11 NOTE — Progress Notes (Signed)
[facility] Progress Note  Subjective: no c/o's, no SOB, mild L flank pain  Vitals:   12/10/20 2200 12/11/20 0200 12/11/20 0602 12/11/20 1329  BP: 132/71 (!) 147/72 (!) 143/80 (!) 147/75  Pulse: 64 73 69 74  Resp: 16 16 18 16   Temp: 98 F (36.7 C) 99.1 F (37.3 C) 98.4 F (36.9 C) 98.1 F (36.7 C)  TempSrc: Oral Oral Oral Oral  SpO2: 100% 99% 100% 100%  Weight:      Height:        Exam: Gen alert, no distress Chest clear bilat to bases, no rales/ wheezing RRR no MRG Abd soft ntnd no mass or ascites +bs GU normal male w/ large bore CBI foley in place w/ irrigation running Urine light pink Ext no LE or UE edema Neuro is alert, Ox 3 , nf         Home meds include norvasc 5 qd/ norco prn/ prednisone 60mg  po daily    Assessment/ Plan: Gross hematuria - sp recent renal biopsy on 8/15. Seen by urology did clot evacuation and has CBI running. Pain much better per the patient. Plan as per urology. Appreciate assistance.  Hb 9's.  Glomerulonephritis - ANCA+ w/ recent biopsy, f/b Dr Joylene Grapes. Creat stable 2.2. We have started him on po PPI and bactrim prophylaxis for Rituxan induction. Rituxan 1 gm ordered for today, or possibly tomorrow. Will get 2nd dose in 2 weeks as outpatient.  SP bolus steroids last week 500 mg x 3 and is getting po pred 60 qd. Creat stable.   HTN - bp's up, ^'d norvasc  to 5 bid     Rob Sirus Labrie 12/11/2020, 4:28 PM   Recent Labs  Lab 12/10/20 0317 12/10/20 0932 12/10/20 1635 12/11/20 0505  K 4.4  --   --  4.4  BUN 42*  --   --  44*  CREATININE 2.22*  --   --  2.65*  CALCIUM 8.5*  --   --  8.3*  HGB 9.1*   < > 9.3* 8.2*   < > = values in this interval not displayed.   Inpatient medications:  [START ON 12/12/2020] acetaminophen  650 mg Oral Once   amLODipine  5 mg Oral BID   Chlorhexidine Gluconate Cloth  6 each Topical Daily   [START ON 12/12/2020] diphenhydrAMINE  50 mg Oral Once   docusate sodium  100 mg Oral BID   famotidine   20 mg Oral Daily   ferrous sulfate  325 mg Oral Daily   predniSONE  60 mg Oral Q breakfast   sulfamethoxazole-trimethoprim  1 tablet Oral Once per day on Mon Wed Fri    [START ON 12/12/2020] famotidine (PEPCID) IV     lactated ringers 75 mL/hr at 12/11/20 0130   methocarbamol (ROBAXIN) IV     [START ON 12/12/2020] riTUXimab (RITUXAN)  NON-ONCOLOGY  infusion     sodium chloride     sodium chloride irrigation 0 mL (12/10/20 0844)   acetaminophen **OR** acetaminophen, [START ON 12/12/2020] albuterol, belladonna-opium, bisacodyl, cloNIDine, [START ON 12/12/2020] diphenhydrAMINE, [START ON 12/12/2020] EPINEPHrine, [START ON 12/12/2020] famotidine (PEPCID) IV, hydrALAZINE, HYDROcodone-acetaminophen, HYDROmorphone (DILAUDID) injection, lidocaine, methocarbamol (ROBAXIN) IV, [START ON 12/12/2020] methylPREDNISolone (SOLU-MEDROL) injection, ondansetron **OR** ondansetron (ZOFRAN) IV, oxybutynin, polyethylene glycol, sodium chloride

## 2020-12-11 NOTE — Progress Notes (Addendum)
Rituximab  infusion note  Patient has ANCA vasculitis on steroids only. Plan to start on rituximab for induction treatment of ANCA vasculitis. Goal of the therapy is induction of remission/curative. Dose is 1g then another 1g dose 14 days later (will be arranged outpatient. He is hepatitis B negative.     I have reviewed why the patient needs this medication with the patient.  We discussed the side effects of this medication including but not limited to allergic reaction, significant immunosuppression and chance of infections.  The patient understands these risks and would like to move forward with treatment.   Will order the dose for today but can be given on 8/25 if this is necessary from nursing perspective.

## 2020-12-11 NOTE — Progress Notes (Signed)
Urology Inpatient Progress Report  Vasculitis ([place]) [I77.6] Hematuria [R31.9] Bladder outlet obstruction [N32.0]        Intv/Subj: No acute events overnight.  Having some flank pain from the biopsy but stable.  Blood pressure stable without hypotension.  Hemoglobin is 8.2 from 9.3 yesterday.  Principal Problem:   Bladder outlet obstruction Active Problems:   ANCA-associated vasculitis (HCC)   Essential hypertension  Current Facility-Administered Medications  Medication Dose Route Frequency Provider Last Rate Last Admin   acetaminophen (TYLENOL) tablet 650 mg  650 mg Oral Q6H PRN Karmen Bongo, MD   650 mg at 12/10/20 6073   Or   acetaminophen (TYLENOL) suppository 650 mg  650 mg Rectal Q6H PRN Karmen Bongo, MD       Derrill Memo ON 12/12/2020] acetaminophen (TYLENOL) tablet 650 mg  650 mg Oral Once Reesa Chew, MD       [START ON 12/12/2020] albuterol (PROVENTIL) (2.5 MG/3ML) 0.083% nebulizer solution 2.5 mg  2.5 mg Nebulization Once PRN Reesa Chew, MD       amLODipine (NORVASC) tablet 5 mg  5 mg Oral BID Roney Jaffe, MD   5 mg at 12/11/20 0845   belladonna-opium (B&O) suppository 16.2-30 mg  1 suppository Rectal Q8H PRN Robley Fries, MD       bisacodyl (DULCOLAX) EC tablet 5 mg  5 mg Oral Daily PRN Karmen Bongo, MD       Chlorhexidine Gluconate Cloth 2 % PADS 6 each  6 each Topical Daily [place], Einar Pheasant, MD   6 each at 12/10/20 1700   cloNIDine (CATAPRES) tablet 0.1 mg  0.1 mg Oral TID PRN Roney Jaffe, MD       Derrill Memo ON 12/12/2020] diphenhydrAMINE (BENADRYL) capsule 50 mg  50 mg Oral Once Reesa Chew, MD       [START ON 12/12/2020] diphenhydrAMINE (BENADRYL) injection 50 mg  50 mg Intravenous Once PRN Reesa Chew, MD       docusate sodium (COLACE) capsule 100 mg  100 mg Oral BID Karmen Bongo, MD   100 mg at 12/11/20 0844   [START ON 12/12/2020] EPINEPHrine (ADRENALIN) 0.3 mg  0.3 mg Intramuscular Q5 min PRN Reesa Chew, MD        [START ON 12/12/2020] famotidine (PEPCID) IVPB 20 mg premix  20 mg Intravenous Once PRN Reesa Chew, MD       famotidine (PEPCID) tablet 20 mg  20 mg Oral Daily Reesa Chew, MD   20 mg at 12/11/20 7106   ferrous sulfate tablet 325 mg  325 mg Oral Daily Karmen Bongo, MD   325 mg at 12/11/20 0845   hydrALAZINE (APRESOLINE) injection 5 mg  5 mg Intravenous Q4H PRN Karmen Bongo, MD       HYDROcodone-acetaminophen (NORCO/VICODIN) 5-325 MG per tablet 1-2 tablet  1-2 tablet Oral Q6H PRN Karmen Bongo, MD   1 tablet at 12/11/20 1530   HYDROmorphone (DILAUDID) injection 0.5 mg  0.5 mg Intravenous Q2H PRN Karmen Bongo, MD   0.5 mg at 12/11/20 0235   lactated ringers infusion   Intravenous Continuous Karmen Bongo, MD 75 mL/hr at 12/11/20 0130 New Bag at 12/11/20 0130   lidocaine (LMX) 4 % cream   Topical TID PRN Robley Fries, MD   Given at 12/10/20 1625   methocarbamol (ROBAXIN) 500 mg in dextrose 5 % 50 mL IVPB  500 mg Intravenous Q6H PRN Karmen Bongo, MD       [START ON 12/12/2020] methylPREDNISolone sodium  succinate (SOLU-MEDROL) 125 mg/2 mL injection 125 mg  125 mg Intravenous Once PRN Reesa Chew, MD       ondansetron [facility]) tablet 4 mg  4 mg Oral Q6H PRN Karmen Bongo, MD       Or   ondansetron [facility]) injection 4 mg  4 mg Intravenous Q6H PRN Karmen Bongo, MD       oxybutynin (DITROPAN) tablet 5 mg  5 mg Oral Q8H PRN Robley Fries, MD       polyethylene glycol (MIRALAX / GLYCOLAX) packet 17 g  17 g Oral Daily PRN Karmen Bongo, MD   17 g at 12/11/20 1304   predniSONE (DELTASONE) tablet 60 mg  60 mg Oral Q breakfast Karmen Bongo, MD   60 mg at 12/11/20 0846   [START ON 12/12/2020] riTUXimab (RITUXAN) 1,000 mg in sodium chloride 0.9 % 250 mL (4 mg/mL) infusion  1,000 mg Intravenous Once Reesa Chew, MD       sodium chloride 0.9 % bolus 1,000 mL  1,000 mL Intravenous Once PRN Reesa Chew, MD       sodium chloride irrigation 0.9 % 3,000 mL   3,000 mL Irrigation Continuous Karmen Bongo, MD 0 mL/hr at 12/10/20 0844 3,000 mL at 12/11/20 1221   sulfamethoxazole-trimethoprim (BACTRIM DS) 800-160 MG per tablet 1 tablet  1 tablet Oral Once per day on Mon Wed Fri Reesa Chew, MD   1 tablet at 12/11/20 0844     Objective: Vital: Vitals:   12/10/20 2200 12/11/20 0200 12/11/20 0602 12/11/20 1329  BP: 132/71 (!) 147/72 (!) 143/80 (!) 147/75  Pulse: 64 73 69 74  Resp: 16 16 18 16   Temp: 98 F (36.7 C) 99.1 F (37.3 C) 98.4 F (36.9 C) 98.1 F (36.7 C)  TempSrc: Oral Oral Oral Oral  SpO2: 100% 99% 100% 100%  Weight:      Height:       I/Os: I/O last 3 completed shifts: In: 5398.1 [P.O.:600; I.V.:518.1; Other:4280] Out: 29476 [[id number]]  Physical Exam:  General: Patient is in no apparent distress Lungs: Normal respiratory effort, chest expands symmetrically. GI: The abdomen is soft and nontender without mass. Foley: Three-way Foley catheter in place draining dark pink urine on slow drip CBI Ext: lower extremities symmetric  Lab Results: Recent Labs    12/10/20 0932 12/10/20 1635 12/11/20 0505  WBC 18.7* 16.8* 16.3*  HGB 9.1* 9.3* 8.2*  HCT 29.3* 30.2* 25.9*   Recent Labs    12/10/20 0317 12/11/20 0505  NA 132* 136  K 4.4 4.4  CL 100 104  CO2 25 24  GLUCOSE 115* 114*  BUN 42* 44*  CREATININE 2.22* 2.65*  CALCIUM 8.5* 8.3*   No results for input(s): LABPT, INR in the last 72 hours. No results for input(s): LABURIN in the last 72 hours. Results for orders placed or performed during the hospital encounter of 12/10/20  Resp Panel by RT-PCR (Flu A&B, Covid) Nasopharyngeal Swab     Status: None   Collection Time: 12/10/20  4:51 AM   Specimen: Nasopharyngeal Swab; Nasopharyngeal(NP) swabs in vial transport medium  Result Value Ref Range Status   SARS Coronavirus 2 by RT PCR NEGATIVE NEGATIVE Final    Comment: (NOTE) SARS-CoV-2 target nucleic acids are NOT DETECTED.  The SARS-CoV-2 RNA is generally  detectable in upper respiratory specimens during the acute phase of infection. The lowest concentration of SARS-CoV-2 viral copies this assay can detect is 138 copies/mL. A negative result does not  preclude SARS-Cov-2 infection and should not be used as the sole basis for treatment or other patient management decisions. A negative result may occur with  improper specimen collection/handling, submission of specimen other than nasopharyngeal swab, presence of viral mutation(s) within the areas targeted by this assay, and inadequate number of viral copies(<138 copies/mL). A negative result must be combined with clinical observations, patient history, and epidemiological information. The expected result is Negative.  Fact Sheet for Patients:  EntrepreneurPulse.com.au  Fact Sheet for Healthcare Providers:  IncredibleEmployment.be  This test is no t yet approved or cleared by the [place] FDA and  has been authorized for detection and/or diagnosis of SARS-CoV-2 by FDA under an Emergency Use Authorization (EUA). This EUA will remain  in effect (meaning this test can be used) for the duration of the COVID-19 declaration under Section 564(b)(1) of the Act, 21 U.S.C.section 360bbb-3(b)(1), unless the authorization is terminated  or revoked sooner.       Influenza A by PCR NEGATIVE NEGATIVE Final   Influenza B by PCR NEGATIVE NEGATIVE Final    Comment: (NOTE) The Xpert Xpress SARS-CoV-2/FLU/RSV plus assay is intended as an aid in the diagnosis of influenza from Nasopharyngeal swab specimens and should not be used as a sole basis for treatment. Nasal washings and aspirates are unacceptable for Xpert Xpress SARS-CoV-2/FLU/RSV testing.  Fact Sheet for Patients: EntrepreneurPulse.com.au  Fact Sheet for Healthcare Providers: IncredibleEmployment.be  This test is not yet approved or cleared by the [place] FDA  and has been authorized for detection and/or diagnosis of SARS-CoV-2 by FDA under an Emergency Use Authorization (EUA). This EUA will remain in effect (meaning this test can be used) for the duration of the COVID-19 declaration under Section 564(b)(1) of the Act, 21 U.S.C. section 360bbb-3(b)(1), unless the authorization is terminated or revoked.  Performed at [facility], [place] [address] [place], [place] [address]     Studies/Results: CT Renal Stone Study  Result Date: 12/10/2020 CLINICAL DATA:  Hematuria following left kidney biopsy. Dysuria with passage of clots. Large bladder clot noted on bladder sonogram. Leukocytosis EXAM: CT ABDOMEN AND PELVIS WITHOUT CONTRAST TECHNIQUE: Multidetector CT imaging of the abdomen and pelvis was performed following the standard protocol without IV contrast. COMPARISON:  None. FINDINGS: Lower chest: Right basilar pleural thickening and associated posterobasal right lower lobe parenchymal scarring noted. The visualized heart and pericardium are unremarkable. Hepatobiliary: No focal liver abnormality is seen. No gallstones, gallbladder wall thickening, or biliary dilatation. Pancreas: Unremarkable Spleen: Unremarkable Adrenals/Urinary Tract: The adrenal glands are unremarkable. The kidneys are normal in size and position. There is mild left hydronephrosis and moderate left perinephric stranding. No subcapsular hematoma or perirenal fluid collection is identified. Minimal nonspecific right perinephric stranding noted. Exophytic simple cortical cyst arises from the lower pole of the right kidney. No nephro or urolithiasis. The bladder is distended with a dependently layering blood clot measuring 6.4 x 4.6 x 6.3 cm. Stomach/Bowel: Stomach is within normal limits. Appendix appears normal. No evidence of bowel wall thickening, distention, or inflammatory changes. No free intraperitoneal gas or fluid. Vascular/Lymphatic: Moderate aortoiliac  atherosclerotic calcification. No aortic aneurysm. No pathologic adenopathy within the abdomen and pelvis. Reproductive: Prostate is unremarkable. Other: Tiny bilateral fat containing inguinal hernias. Rectum unremarkable. Musculoskeletal: Degenerative changes are seen within the lumbar spine. No acute bone abnormality. No lytic or blastic bone lesion. IMPRESSION: 6.4 cm dependently layering blood clot within the bladder lumen. Mild bladder distension suggests some degree of bladder outlet obstruction. Mild left  hydronephrosis and moderate left perinephric stranding noted. No subcapsular hematoma or pararenal fluid collection or hematoma identified, however. Note that the presence of an underlying arteriovenous fistula, pseudoaneurysm, or extravasation into the collecting system itself is not well assessed in absence of contrast administration Aortic Atherosclerosis (ICD10-I70.0). Electronically Signed   By: Fidela Salisbury M.D.   On: 12/10/2020 04:29    Assessment: Gross hematuria secondary to renal biopsy  Plan: I discussed the option of CTA which is the ideal imaging choice to evaluate for pseudoaneurysm or arteriovenous fistula.  Given his creatinine, this would carry the risk of renal failure and I explained this to him.  Interventional radiology angiogram would carry the same risk.  Given that his hemoglobin is relatively stable and vitals have been stable, recommend continued observation.  If hemoglobin continues to drop requiring transfusions or if he becomes clinically unstable would recommend CTA.   Link Snuffer, MD Urology 12/11/2020, 5:13 PM

## 2020-12-11 NOTE — Progress Notes (Signed)
Provided reflective listening as patient shared about his experience of being in the hospital several times recently.  Provided prayer and ministry of presence.  [place], Bcc Pager, [phone] 4:45 PM

## 2020-12-11 NOTE — Plan of Care (Signed)
  Problem: Clinical Measurements: Goal: Will remain free from infection Outcome: Completed/Met

## 2020-12-11 NOTE — Progress Notes (Addendum)
PROGRESS NOTE    Sergio Sims  ASN:[id number] DOB: 26-Nov-1955 DOA: 12/10/2020 PCP: Pieter Partridge, PA   Chief Complaint  Patient presents with   Flank Pain   Hematuria   Brief Narrative: 65 year old male with history of hypertension, ANCA positive vasculitis left kidney biopsy 8/20 presented with dysuria, hematuria passing clots left flank pain.  He had an ultrasound at that site that showed large hematoma in the bladder, was given pain control. Patient was seen in the ED, CT scan showed about 6.4 cm clot in the bladder , some mild bladder distention with mild left hydronephrosis with reported left perinephric stranding.  Foley catheter was placed with continuous irrigation in the ED.  Sodium 132 today glucose 115 creatinine 2.22 BUN 42 GFR 32.  Prior BUN of 56 creatinine 2.95 GFR 23.  Patient appears to have CKD 4.  Baseline H&H 2 weeks ago 8.4 and 26.4 today is 9.1 and 29.3.  Platelets are 359.  COVID and flu are negative.  UA showing red color 50 glucose moderate hemoglobin trace leuks no nitrites.  Zofran, IV fluids and prednisone ordered in the ED. Urology and nephrology were consulted  Subjective: Alert,awake. Mild left flank pain. Only. Was miserable yesterday BI is on and urine is pinkish  Assessment & Plan:  Bladder outlet obstruction Gross hematuria with clot Acute blood loss anemia due to hematuria Status post recent biopsy on 8/15, urology on board on CBI monitor H&H Recent Labs  Lab 12/10/20 0317 12/10/20 0932 12/10/20 1635 12/11/20 0505  HGB 9.1* 9.1* 9.3* 8.2*  HCT 29.3* 29.3* 30.2* 25.9*   Glomerulonephritis,ANCA-associated vasculitis-with recent biopsy, continue plan per nephrology p.o. PPI, Bactrim prophylaxis for possible Rituxan induction, con steroid 60 mg.  CKD 4 creatinine creat slightly up. Recent Labs    11/15/20 1525 11/16/20 0204 11/17/20 0224 12/01/20 0852 12/02/20 0500 12/03/20 0511 12/10/20 0317 12/11/20 0505  BUN 32* 29* 27* 37* 45* 56*  42* 44*  CREATININE 2.20* 2.31* 2.05* 2.66* 2.50* 2.95* 2.22* 2.65*    Essential hypertension: Controlled on amlodipine Hyponatremia-monitor Hx of iron-deficiency anemia -cont iron sulfate Overweight:bmi 28.  Diet Order             Diet renal with fluid restriction Room service appropriate? Yes; Fluid consistency: Thin  Diet effective now                   Patient's Body mass index is 28.21 kg/m. DVT prophylaxis: Place and maintain sequential compression device Start: 12/10/20 0931 SCDs Start: 12/10/20 0855 Code Status:   Code Status: Full Code  Family Communication: plan of care discussed with patient at bedside. Status is: Inpatient  Remains inpatient appropriate because:IV treatments appropriate due to intensity of illness or inability to take PO and Inpatient level of care appropriate due to severity of illness  Dispo: the patient is from: Home              Anticipated d/c is to: Home              Patient currently is not medically stable to d/c.   Difficult to place patient No Unresulted Labs (From admission, onward)    None      Medications reviewed: Scheduled Meds:  [START ON 12/12/2020] acetaminophen  650 mg Oral Once   amLODipine  5 mg Oral BID   Chlorhexidine Gluconate Cloth  6 each Topical Daily   [START ON 12/12/2020] diphenhydrAMINE  50 mg Oral Once   docusate sodium  100 mg  Oral BID   famotidine  20 mg Oral Daily   ferrous sulfate  325 mg Oral Daily   predniSONE  60 mg Oral Q breakfast   sulfamethoxazole-trimethoprim  1 tablet Oral Once per day on Mon Wed Fri   Continuous Infusions:  [START ON 12/12/2020] famotidine (PEPCID) IV     lactated ringers 75 mL/hr at 12/11/20 0130   methocarbamol (ROBAXIN) IV     [START ON 12/12/2020] riTUXimab (RITUXAN)  NON-ONCOLOGY  infusion     sodium chloride     sodium chloride irrigation Stopped (12/10/20 0844)   Consultants:see note  Procedures:see note Antimicrobials: Anti-infectives (From admission, onward)     Start     Dose/Rate Route Frequency Ordered Stop   12/11/20 0900  sulfamethoxazole-trimethoprim (BACTRIM DS) 800-160 MG per tablet 1 tablet        1 tablet Oral Once per day on Mon Wed Fri 12/10/20 0946        Culture/Microbiology No results found for: SDES, SPECREQUEST, CULT, REPTSTATUS  Other culture-see note  Objective: Vitals: Today's Vitals   12/11/20 0305 12/11/20 0602 12/11/20 0730 12/11/20 0815  BP:  (!) 143/80    Pulse:  69    Resp:  18    Temp:  98.4 F (36.9 C)    TempSrc:  Oral    SpO2:  100%    Weight:      Height:      PainSc: Asleep  5  2     Intake/Output Summary (Last 24 hours) at 12/11/2020 0956 Last data filed at 12/11/2020 0916 Gross per 24 hour  Intake 5758.06 ml  Output 9250 ml  Net -3491.94 ml   Filed Weights   12/10/20 0116  Weight: 94.3 kg   Weight change:   Intake/Output from previous day: 08/23 0701 - 08/24 0700 In: 5398.1 [P.O.:600; I.V.:518.1] Out: 16109 [UEAVW:09811] Intake/Output this shift: Total I/O In: 360 [P.O.:360] Out: 1375 [Urine:1375] Filed Weights   12/10/20 0116  Weight: 94.3 kg   Examination: General exam: AAO 3 , older than stated age, weak appearing. HEENT:Oral mucosa moist, Ear/Nose WNL grossly,dentition normal. Respiratory system: bilaterally diminished, no use of accessory muscle, non tender. Cardiovascular system: S1 & S2 +,No JVD. Gastrointestinal system: Abdomen soft, NT,ND, BS+. Nervous System:Alert, awake, moving extremities Extremities: NO edema, distal peripheral pulses palpable.  Skin: No rashes,no icterus. MSK: Normal muscle bulk,tone, power Data Reviewed: I have personally reviewed following labs and imaging studies CBC: Recent Labs  Lab 12/10/20 0317 12/10/20 0932 12/10/20 1635 12/11/20 0505  WBC 19.1* 18.7* 16.8* 16.3*  NEUTROABS 16.2*  --   --   --   HGB 9.1* 9.1* 9.3* 8.2*  HCT 29.3* 29.3* 30.2* 25.9*  MCV 87.7 87.7 89.3 88.7  PLT 359 363 323 914   Basic Metabolic Panel: Recent  Labs  Lab 12/10/20 0317 12/11/20 0505  NA 132* 136  K 4.4 4.4  CL 100 104  CO2 25 24  GLUCOSE 115* 114*  BUN 42* 44*  CREATININE 2.22* 2.65*  CALCIUM 8.5* 8.3*   GFR: Estimated Creatinine Clearance: 32.3 mL/min (A) (by C-G formula based on SCr of 2.65 mg/dL (H)). Liver Function Tests: No results for input(s): AST, ALT, ALKPHOS, BILITOT, PROT, ALBUMIN in the last 168 hours. No results for input(s): LIPASE, AMYLASE in the last 168 hours. No results for input(s): AMMONIA in the last 168 hours. Coagulation Profile: No results for input(s): INR, PROTIME in the last 168 hours. Cardiac Enzymes: No results for input(s): CKTOTAL, CKMB, CKMBINDEX, TROPONINI  in the last 168 hours. BNP (last 3 results) No results for input(s): PROBNP in the last 8760 hours. HbA1C: No results for input(s): HGBA1C in the last 72 hours. CBG: No results for input(s): GLUCAP in the last 168 hours. Lipid Profile: No results for input(s): CHOL, HDL, LDLCALC, TRIG, CHOLHDL, LDLDIRECT in the last 72 hours. Thyroid Function Tests: No results for input(s): TSH, T4TOTAL, FREET4, T3FREE, THYROIDAB in the last 72 hours. Anemia Panel: No results for input(s): VITAMINB12, FOLATE, FERRITIN, TIBC, IRON, RETICCTPCT in the last 72 hours. Sepsis Labs: No results for input(s): PROCALCITON, LATICACIDVEN in the last 168 hours.  Recent Results (from the past 240 hour(s))  Resp Panel by RT-PCR (Flu A&B, Covid) Nasopharyngeal Swab     Status: None   Collection Time: 12/01/20  5:06 PM   Specimen: Nasopharyngeal Swab; Nasopharyngeal(NP) swabs in vial transport medium  Result Value Ref Range Status   SARS Coronavirus 2 by RT PCR NEGATIVE NEGATIVE Final    Comment: (NOTE) SARS-CoV-2 target nucleic acids are NOT DETECTED.  The SARS-CoV-2 RNA is generally detectable in upper respiratory specimens during the acute phase of infection. The lowest concentration of SARS-CoV-2 viral copies this assay can detect is 138 copies/mL. A  negative result does not preclude SARS-Cov-2 infection and should not be used as the sole basis for treatment or other patient management decisions. A negative result may occur with  improper specimen collection/handling, submission of specimen other than nasopharyngeal swab, presence of viral mutation(s) within the areas targeted by this assay, and inadequate number of viral copies(<138 copies/mL). A negative result must be combined with clinical observations, patient history, and epidemiological information. The expected result is Negative.  Fact Sheet for Patients:  EntrepreneurPulse.com.au  Fact Sheet for Healthcare Providers:  IncredibleEmployment.be  This test is no t yet approved or cleared by the [place] FDA and  has been authorized for detection and/or diagnosis of SARS-CoV-2 by FDA under an Emergency Use Authorization (EUA). This EUA will remain  in effect (meaning this test can be used) for the duration of the COVID-19 declaration under Section 564(b)(1) of the Act, 21 U.S.C.section 360bbb-3(b)(1), unless the authorization is terminated  or revoked sooner.       Influenza A by PCR NEGATIVE NEGATIVE Final   Influenza B by PCR NEGATIVE NEGATIVE Final    Comment: (NOTE) The Xpert Xpress SARS-CoV-2/FLU/RSV plus assay is intended as an aid in the diagnosis of influenza from Nasopharyngeal swab specimens and should not be used as a sole basis for treatment. Nasal washings and aspirates are unacceptable for Xpert Xpress SARS-CoV-2/FLU/RSV testing.  Fact Sheet for Patients: EntrepreneurPulse.com.au  Fact Sheet for Healthcare Providers: IncredibleEmployment.be  This test is not yet approved or cleared by the [place] FDA and has been authorized for detection and/or diagnosis of SARS-CoV-2 by FDA under an Emergency Use Authorization (EUA). This EUA will remain in effect (meaning this test can  be used) for the duration of the COVID-19 declaration under Section 564(b)(1) of the Act, 21 U.S.C. section 360bbb-3(b)(1), unless the authorization is terminated or revoked.  Performed at [facility], [place] [address] [place], [place] [address]   Resp Panel by RT-PCR (Flu A&B, Covid) Nasopharyngeal Swab     Status: None   Collection Time: 12/10/20  4:51 AM   Specimen: Nasopharyngeal Swab; Nasopharyngeal(NP) swabs in vial transport medium  Result Value Ref Range Status   SARS Coronavirus 2 by RT PCR NEGATIVE NEGATIVE Final    Comment: (NOTE) SARS-CoV-2 target nucleic  acids are NOT DETECTED.  The SARS-CoV-2 RNA is generally detectable in upper respiratory specimens during the acute phase of infection. The lowest concentration of SARS-CoV-2 viral copies this assay can detect is 138 copies/mL. A negative result does not preclude SARS-Cov-2 infection and should not be used as the sole basis for treatment or other patient management decisions. A negative result may occur with  improper specimen collection/handling, submission of specimen other than nasopharyngeal swab, presence of viral mutation(s) within the areas targeted by this assay, and inadequate number of viral copies(<138 copies/mL). A negative result must be combined with clinical observations, patient history, and epidemiological information. The expected result is Negative.  Fact Sheet for Patients:  EntrepreneurPulse.com.au  Fact Sheet for Healthcare Providers:  IncredibleEmployment.be  This test is no t yet approved or cleared by the [place] FDA and  has been authorized for detection and/or diagnosis of SARS-CoV-2 by FDA under an Emergency Use Authorization (EUA). This EUA will remain  in effect (meaning this test can be used) for the duration of the COVID-19 declaration under Section 564(b)(1) of the Act, 21 U.S.C.section 360bbb-3(b)(1), unless the authorization is  terminated  or revoked sooner.       Influenza A by PCR NEGATIVE NEGATIVE Final   Influenza B by PCR NEGATIVE NEGATIVE Final    Comment: (NOTE) The Xpert Xpress SARS-CoV-2/FLU/RSV plus assay is intended as an aid in the diagnosis of influenza from Nasopharyngeal swab specimens and should not be used as a sole basis for treatment. Nasal washings and aspirates are unacceptable for Xpert Xpress SARS-CoV-2/FLU/RSV testing.  Fact Sheet for Patients: EntrepreneurPulse.com.au  Fact Sheet for Healthcare Providers: IncredibleEmployment.be  This test is not yet approved or cleared by the [place] FDA and has been authorized for detection and/or diagnosis of SARS-CoV-2 by FDA under an Emergency Use Authorization (EUA). This EUA will remain in effect (meaning this test can be used) for the duration of the COVID-19 declaration under Section 564(b)(1) of the Act, 21 U.S.C. section 360bbb-3(b)(1), unless the authorization is terminated or revoked.  Performed at [facility], [place] [address] [place], [place] [address]      Radiology Studies: CT Renal Stone Study  Result Date: 12/10/2020 CLINICAL DATA:  Hematuria following left kidney biopsy. Dysuria with passage of clots. Large bladder clot noted on bladder sonogram. Leukocytosis EXAM: CT ABDOMEN AND PELVIS WITHOUT CONTRAST TECHNIQUE: Multidetector CT imaging of the abdomen and pelvis was performed following the standard protocol without IV contrast. COMPARISON:  None. FINDINGS: Lower chest: Right basilar pleural thickening and associated posterobasal right lower lobe parenchymal scarring noted. The visualized heart and pericardium are unremarkable. Hepatobiliary: No focal liver abnormality is seen. No gallstones, gallbladder wall thickening, or biliary dilatation. Pancreas: Unremarkable Spleen: Unremarkable Adrenals/Urinary Tract: The adrenal glands are unremarkable. The kidneys are  normal in size and position. There is mild left hydronephrosis and moderate left perinephric stranding. No subcapsular hematoma or perirenal fluid collection is identified. Minimal nonspecific right perinephric stranding noted. Exophytic simple cortical cyst arises from the lower pole of the right kidney. No nephro or urolithiasis. The bladder is distended with a dependently layering blood clot measuring 6.4 x 4.6 x 6.3 cm. Stomach/Bowel: Stomach is within normal limits. Appendix appears normal. No evidence of bowel wall thickening, distention, or inflammatory changes. No free intraperitoneal gas or fluid. Vascular/Lymphatic: Moderate aortoiliac atherosclerotic calcification. No aortic aneurysm. No pathologic adenopathy within the abdomen and pelvis. Reproductive: Prostate is unremarkable. Other: Tiny bilateral fat containing inguinal hernias. Rectum unremarkable.  Musculoskeletal: Degenerative changes are seen within the lumbar spine. No acute bone abnormality. No lytic or blastic bone lesion. IMPRESSION: 6.4 cm dependently layering blood clot within the bladder lumen. Mild bladder distension suggests some degree of bladder outlet obstruction. Mild left hydronephrosis and moderate left perinephric stranding noted. No subcapsular hematoma or pararenal fluid collection or hematoma identified, however. Note that the presence of an underlying arteriovenous fistula, pseudoaneurysm, or extravasation into the collecting system itself is not well assessed in absence of contrast administration Aortic Atherosclerosis (ICD10-I70.0). Electronically Signed   By: Fidela Salisbury M.D.   On: 12/10/2020 04:29     LOS: 1 day   Antonieta Pert, MD [facility]  12/11/2020, 9:56 AM

## 2020-12-11 NOTE — Care Plan (Signed)
Informed by nursing that patient had a lot of questions, came at bedside. I have informed him about plan to continue CBI until urine is clear as urology plan and to continue with nephrology recommendation this morning and reiterated the same.  He wanted to to find out "how long the CBI for?"  He wanted to see if he is still bleeding or not from where they did the biopsy in kidney."I think they went too deep on biopsy, they did not get good result." He wanted to see if I can pull of the CT scan and biopsy film and explain him with images.  I tried to answer his questions to the best of my ability. I explained him my limited expertise on interpretation/reading of the actual ultrasound and CT images but I tried pulling up the CT scan and ultrasound biopsy report and explained him but also informed him that this will be probably best done by his urologist/radiologist.  Urology and nephrology has been informed about patient's request to talk.  Urology was walking and as a walked out

## 2020-12-12 DIAGNOSIS — N320 Bladder-neck obstruction: Secondary | ICD-10-CM | POA: Diagnosis not present

## 2020-12-12 LAB — BASIC METABOLIC PANEL
Anion gap: 6 (ref 5–15)
BUN: 40 mg/dL — ABNORMAL HIGH (ref 8–23)
CO2: 25 mmol/L (ref 22–32)
Calcium: 8.4 mg/dL — ABNORMAL LOW (ref 8.9–10.3)
Chloride: 105 mmol/L (ref 98–111)
Creatinine, Ser: 2.71 mg/dL — ABNORMAL HIGH (ref 0.61–1.24)
GFR, Estimated: 25 mL/min — ABNORMAL LOW (ref >60)
Glucose, Bld: 100 mg/dL — ABNORMAL HIGH (ref 70–99)
Potassium: 4.4 mmol/L (ref 3.5–5.1)
Sodium: 136 mmol/L (ref 135–145)

## 2020-12-12 LAB — CBC
HCT: 25.2 % — ABNORMAL LOW (ref 39.0–52.0)
Hemoglobin: 7.9 g/dL — ABNORMAL LOW (ref 13.0–17.0)
MCH: 28.1 pg (ref 26.0–34.0)
MCHC: 31.3 g/dL (ref 30.0–36.0)
MCV: 89.7 fL (ref 80.0–100.0)
Platelets: 256 10*3/uL (ref 150–400)
RBC: 2.81 MIL/uL — ABNORMAL LOW (ref 4.22–5.81)
RDW: 18.6 % — ABNORMAL HIGH (ref 11.5–15.5)
WBC: 12.5 10*3/uL — ABNORMAL HIGH (ref 4.0–10.5)
nRBC: 0 % (ref 0.0–0.2)

## 2020-12-12 MED ORDER — PANTOPRAZOLE SODIUM 40 MG PO TBEC
40.0000 mg | DELAYED_RELEASE_TABLET | Freq: Every day | ORAL | Status: DC
  Administered 2020-12-13: 40 mg via ORAL
  Filled 2020-12-12: qty 1

## 2020-12-12 MED ORDER — ADULT MULTIVITAMIN W/MINERALS CH
1.0000 | ORAL_TABLET | Freq: Every day | ORAL | Status: DC
  Administered 2020-12-12 – 2020-12-13 (×2): 1 via ORAL
  Filled 2020-12-12 (×2): qty 1

## 2020-12-12 NOTE — Progress Notes (Signed)
[facility] Progress Note  Subjective: no c/o's. Hb 7.9  Vitals:   12/12/20 1305 12/12/20 1404 12/12/20 1500 12/12/20 1608  BP: (!) 181/73 (!) 159/63 135/73 139/66  Pulse: 74 81 64 65  Resp: 19 18 18 19   Temp: 98.2 F (36.8 C) 98 F (36.7 C) 97.8 F (36.6 C) 98.2 F (36.8 C)  TempSrc: Oral Oral Oral Oral  SpO2: 97% 100% 98% 99%  Weight:      Height:        Exam: Gen alert, no distress Chest clear bilat to bases, no rales/ wheezing RRR no MRG Abd soft ntnd no mass or ascites +bs GU normal male w/ large bore CBI foley in place w/ irrigation running Urine light pink Ext no LE or UE edema Neuro is alert, Ox 3 , nf         Home meds include norvasc 5 qd/ norco prn/ prednisone 60mg  po daily    Assessment/ Plan: Gross hematuria - sp recent renal biopsy on 8/15. Seen by urology who performed bladder clot evacuation and started CBI. Plan as per urology. Hb 9.1 prebiopsy on 8/14 >> Hb 7.9 today. No need for CTA at this time per urology.  Glomerulonephritis - ANCA+ w/ recent biopsy, f/b Dr Joylene Grapes. Creat stable 2.2. SP bolus steroids last week 500 mg x 3 and is getting po pred 60 qd. Started po PPI and bactrim prophylaxis in preparation for Rituxan induction, getting 1st dose today 1gm. Will get 2nd dose in 2 weeks as outpatient.  Creat up a bit to 2.71.   HTN - bp's up, ^'d norvasc  to 5 bid     Rob Dequante Tremaine 12/12/2020, 4:41 PM   Recent Labs  Lab 12/11/20 0505 12/11/20 1911 12/12/20 0435  K 4.4  --  4.4  BUN 44*  --  40*  CREATININE 2.65*  --  2.71*  CALCIUM 8.3*  --  8.4*  HGB 8.2* 8.7* 7.9*    Inpatient medications:  amLODipine  5 mg Oral BID   Chlorhexidine Gluconate Cloth  6 each Topical Daily   docusate sodium  100 mg Oral BID   famotidine  20 mg Oral Daily   ferrous sulfate  325 mg Oral Daily   multivitamin with minerals  1 tablet Oral Daily   predniSONE  60 mg Oral Q breakfast   sulfamethoxazole-trimethoprim  1 tablet Oral Once per day on  Mon Wed Fri    famotidine (PEPCID) IV     lactated ringers 75 mL/hr at 12/12/20 0325   methocarbamol (ROBAXIN) IV     sodium chloride     sodium chloride irrigation 0 mL (12/10/20 0844)   acetaminophen **OR** acetaminophen, albuterol, belladonna-opium, bisacodyl, cloNIDine, diphenhydrAMINE, EPINEPHrine, famotidine (PEPCID) IV, hydrALAZINE, HYDROcodone-acetaminophen, HYDROmorphone (DILAUDID) injection, lidocaine, methocarbamol (ROBAXIN) IV, methylPREDNISolone (SOLU-MEDROL) injection, ondansetron **OR** ondansetron (ZOFRAN) IV, oxybutynin, polyethylene glycol, sodium chloride

## 2020-12-12 NOTE — Progress Notes (Signed)
PROGRESS NOTE    Sergio Sims  LZJ:[id number] DOB: [birth year] DOA: 12/10/2020 PCP: Pieter Partridge, PA   Chief Complaint  Patient presents with   Flank Pain   Hematuria   Brief Narrative: 65 year old male with history of hypertension, ANCA positive vasculitis left kidney biopsy 8/20 presented with dysuria, hematuria passing clots left flank pain.  He had an ultrasound at that site that showed large hematoma in the bladder, was given pain control. Patient was seen in the ED, CT scan showed about 6.4 cm clot in the bladder , some mild bladder distention with mild left hydronephrosis with reported left perinephric stranding.  Foley catheter was placed with continuous irrigation in the ED.  Sodium 132 today glucose 115 creatinine 2.22 BUN 42 GFR 32.  Prior BUN of 56 creatinine 2.95 GFR 23.  Patient appears to have CKD 4.  Baseline H&H 2 weeks ago 8.4 and 26.4 today is 9.1 and 29.3.  Platelets are 359.  COVID and flu are negative.  UA showing red color 50 glucose moderate hemoglobin trace leuks no nitrites.  Zofran, IV fluids and prednisone ordered in the ED.Urology and nephrology were consulted  Subjective: Seen and examined this morning no flank pain feels improved today Overnight afebrile. Wbc appears better and H/H at 7.9 from 8.2-8.7- variable. On CBI  Assessment & Plan:  Bladder outlet obstruction Gross hematuria with clot Acute blood loss anemia due to hematuria on anemia of chronic disease-havseline 8-9 gm recently 12/03/20 In the setting of recent left renal biopsy with resulting hematoma in the bladder  on [place] 8/19. had biopsy on 8/15.  Remains on CBI-and seen by urology this morning, CBI remains clear and plan is for slow drip and clear clamping today and TOV tomorrow-CTA not advised as hematuria improving and renal fun worsening.Defer to uroogy for further plan. Hh is variable, cont to monitor. Recent Labs  Lab 12/10/20 0932 12/10/20 1635 12/11/20 0505 12/11/20 1911 12/12/20 0435   HGB 9.1* 9.3* 8.2* 8.7* 7.9*  HCT 29.3* 30.2* 25.9* 27.3* 25.2*   Glomerulonephritis,ANCA-associated vasculitis-with recent biopsy, continue plan per nephrology - prednisone 60 mg, w/ prophylactic PPI, Bactrim and started Rituxan induction 12/12/20.  Leukocytosis suspect in the setting of high dose steroid currently afebrile.  Monitor,  Recent Labs  Lab 12/10/20 0317 12/10/20 0932 12/10/20 1635 12/11/20 0505 12/12/20 0435  WBC 19.1* 18.7* 16.8* 16.3* 12.5*    . AKI on CKD 4 creatinine creat slightly uptrending- cont to monitor per nephro. Recent Labs    11/15/20 1525 11/16/20 0204 11/17/20 0224 12/01/20 0852 12/02/20 0500 12/03/20 0511 12/10/20 0317 12/11/20 0505 12/12/20 0435  BUN 32* 29* 27* 37* 45* 56* 42* 44* 40*  CREATININE 2.20* 2.31* 2.05* 2.66* 2.50* 2.95* 2.22* 2.65* 2.71*    Essential hypertension: Fairly controlled on amlodipine Hyponatremia-resolve Hx of iron-deficiency anemia -cont iron sulfate Overweight:bmi 28.  Diet Order             Diet renal with fluid restriction Room service appropriate? Yes; Fluid consistency: Thin  Diet effective now                   Patient's Body mass index is 28.21 kg/m. DVT prophylaxis: Place and maintain sequential compression device Start: 12/10/20 0931 SCDs Start: 12/10/20 0855 Code Status:   Code Status: Full Code  Family Communication: plan of care discussed with patient at bedside. Status is: Inpatient  Remains inpatient appropriate because:IV treatments appropriate due to intensity of illness or inability to take PO and  Inpatient level of care appropriate due to severity of illness  Dispo: the patient is from: Home              Anticipated d/c is to: Home               Patient currently is not medically stable to d/c.   Difficult to place patient No Unresulted Labs (From admission, onward)     Start     Ordered   12/12/20 6546  Basic metabolic panel  Daily,   R     Question:  Specimen collection  method  Answer:  Lab=Lab collect   12/11/20 0957   12/12/20 0500  CBC  Daily,   R     Question:  Specimen collection method  Answer:  Lab=Lab collect   12/11/20 0957           Medications reviewed: Scheduled Meds:  amLODipine  5 mg Oral BID   Chlorhexidine Gluconate Cloth  6 each Topical Daily   docusate sodium  100 mg Oral BID   famotidine  20 mg Oral Daily   ferrous sulfate  325 mg Oral Daily   predniSONE  60 mg Oral Q breakfast   sulfamethoxazole-trimethoprim  1 tablet Oral Once per day on Mon Wed Fri   Continuous Infusions:  famotidine (PEPCID) IV     lactated ringers 75 mL/hr at 12/12/20 0325   methocarbamol (ROBAXIN) IV     sodium chloride     sodium chloride irrigation 0 mL (12/10/20 0844)   Consultants:see note  Procedures:see note Antimicrobials: Anti-infectives (From admission, onward)    Start     Dose/Rate Route Frequency Ordered Stop   12/11/20 0900  sulfamethoxazole-trimethoprim (BACTRIM DS) 800-160 MG per tablet 1 tablet        1 tablet Oral Once per day on Mon Wed Fri 12/10/20 0946        Culture/Microbiology No results found for: SDES, SPECREQUEST, CULT, REPTSTATUS  Other culture-see note  Objective: Vitals: Today's Vitals   12/12/20 0805 12/12/20 0806 12/12/20 0850 12/12/20 1124  BP:  (!) 171/78  (!) 163/66  Pulse:  75  75  Resp:    16  Temp:    98 F (36.7 C)  TempSrc:    Oral  SpO2:  100%  100%  Weight:      Height:      PainSc: 6   3      Intake/Output Summary (Last 24 hours) at 12/12/2020 1140 Last data filed at 12/12/2020 1123 Gross per 24 hour  Intake 7453.91 ml  Output 20800 ml  Net -13346.09 ml   Filed Weights   12/10/20 0116  Weight: 94.3 kg   Weight change:   Intake/Output from previous day: 08/24 0701 - 08/25 0700 In: 7933.9 [P.O.:840; I.V.:1693.9] Out: 22575 [Urine:22575] Intake/Output this shift: Total I/O In: -  Out: 1100 [Urine:1100] Filed Weights   12/10/20 0116  Weight: 94.3 kg   Examination: General  exam: AAOx3, not in distress, pleasant, on room air HEENT:Oral mucosa moist, Ear/Nose WNL grossly, dentition normal. Respiratory system: bilaterally diminished, no use of accessory muscle Cardiovascular system: S1 & S2 +, No JVD,. Gastrointestinal system: Abdomen soft NT,ND, BS+ Nervous System:Alert, awake, moving extremities and grossly nonfocal Extremities: no edema, distal peripheral pulses palpable.  Skin: No rashes,no icterus. MSK: Normal muscle bulk,tone, power  Foley catheter in place with CBI with light pink urine  Data Reviewed: I have personally reviewed following labs and imaging studies CBC: Recent Labs  Lab 12/10/20 0317 12/10/20 0932 12/10/20 1635 12/11/20 0505 12/11/20 1911 12/12/20 0435  WBC 19.1* 18.7* 16.8* 16.3*  --  12.5*  NEUTROABS 16.2*  --   --   --   --   --   HGB 9.1* 9.1* 9.3* 8.2* 8.7* 7.9*  HCT 29.3* 29.3* 30.2* 25.9* 27.3* 25.2*  MCV 87.7 87.7 89.3 88.7  --  89.7  PLT 359 363 323 290  --  734   Basic Metabolic Panel: Recent Labs  Lab 12/10/20 0317 12/11/20 0505 12/12/20 0435  NA 132* 136 136  K 4.4 4.4 4.4  CL 100 104 105  CO2 25 24 25   GLUCOSE 115* 114* 100*  BUN 42* 44* 40*  CREATININE 2.22* 2.65* 2.71*  CALCIUM 8.5* 8.3* 8.4*   GFR: Estimated Creatinine Clearance: 31.5 mL/min (A) (by C-G formula based on SCr of 2.71 mg/dL (H)). Liver Function Tests: No results for input(s): AST, ALT, ALKPHOS, BILITOT, PROT, ALBUMIN in the last 168 hours. No results for input(s): LIPASE, AMYLASE in the last 168 hours. No results for input(s): AMMONIA in the last 168 hours. Coagulation Profile: No results for input(s): INR, PROTIME in the last 168 hours. Cardiac Enzymes: No results for input(s): CKTOTAL, CKMB, CKMBINDEX, TROPONINI in the last 168 hours. BNP (last 3 results) No results for input(s): PROBNP in the last 8760 hours. HbA1C: No results for input(s): HGBA1C in the last 72 hours. CBG: No results for input(s): GLUCAP in the last 168  hours. Lipid Profile: No results for input(s): CHOL, HDL, LDLCALC, TRIG, CHOLHDL, LDLDIRECT in the last 72 hours. Thyroid Function Tests: No results for input(s): TSH, T4TOTAL, FREET4, T3FREE, THYROIDAB in the last 72 hours. Anemia Panel: No results for input(s): VITAMINB12, FOLATE, FERRITIN, TIBC, IRON, RETICCTPCT in the last 72 hours. Sepsis Labs: No results for input(s): PROCALCITON, LATICACIDVEN in the last 168 hours.  Recent Results (from the past 240 hour(s))  Resp Panel by RT-PCR (Flu A&B, Covid) Nasopharyngeal Swab     Status: None   Collection Time: 12/10/20  4:51 AM   Specimen: Nasopharyngeal Swab; Nasopharyngeal(NP) swabs in vial transport medium  Result Value Ref Range Status   SARS Coronavirus 2 by RT PCR NEGATIVE NEGATIVE Final    Comment: (NOTE) SARS-CoV-2 target nucleic acids are NOT DETECTED.  The SARS-CoV-2 RNA is generally detectable in upper respiratory specimens during the acute phase of infection. The lowest concentration of SARS-CoV-2 viral copies this assay can detect is 138 copies/mL. A negative result does not preclude SARS-Cov-2 infection and should not be used as the sole basis for treatment or other patient management decisions. A negative result may occur with  improper specimen collection/handling, submission of specimen other than nasopharyngeal swab, presence of viral mutation(s) within the areas targeted by this assay, and inadequate number of viral copies(<138 copies/mL). A negative result must be combined with clinical observations, patient history, and epidemiological information. The expected result is Negative.  Fact Sheet for Patients:  EntrepreneurPulse.com.au  Fact Sheet for Healthcare Providers:  IncredibleEmployment.be  This test is no t yet approved or cleared by the [place] FDA and  has been authorized for detection and/or diagnosis of SARS-CoV-2 by FDA under an Emergency Use Authorization  (EUA). This EUA will remain  in effect (meaning this test can be used) for the duration of the COVID-19 declaration under Section 564(b)(1) of the Act, 21 U.S.C.section 360bbb-3(b)(1), unless the authorization is terminated  or revoked sooner.       Influenza A by PCR NEGATIVE NEGATIVE  Final   Influenza B by PCR NEGATIVE NEGATIVE Final    Comment: (NOTE) The Xpert Xpress SARS-CoV-2/FLU/RSV plus assay is intended as an aid in the diagnosis of influenza from Nasopharyngeal swab specimens and should not be used as a sole basis for treatment. Nasal washings and aspirates are unacceptable for Xpert Xpress SARS-CoV-2/FLU/RSV testing.  Fact Sheet for Patients: EntrepreneurPulse.com.au  Fact Sheet for Healthcare Providers: IncredibleEmployment.be  This test is not yet approved or cleared by the [place] FDA and has been authorized for detection and/or diagnosis of SARS-CoV-2 by FDA under an Emergency Use Authorization (EUA). This EUA will remain in effect (meaning this test can be used) for the duration of the COVID-19 declaration under Section 564(b)(1) of the Act, 21 U.S.C. section 360bbb-3(b)(1), unless the authorization is terminated or revoked.  Performed at [facility], [place] [address], [place], [place] [address]      Radiology Studies: No results found.   LOS: 2 days   Antonieta Pert, MD [facility]  12/12/2020, 11:40 AM

## 2020-12-12 NOTE — Plan of Care (Signed)

## 2020-12-12 NOTE — Progress Notes (Signed)
Patient receiving Rituximab infusion and currently at 150mg /hr.  Will remain at 150mg /hr until completion of infusion.  Will inform nurse and tech to complete vitals every hour until complete. No signs of reaction at this time.

## 2020-12-12 NOTE — Progress Notes (Addendum)
Pt CBI Is on slow drip. Pt output is still red to pinkish colored it hasnot cleared yet. Rituximab infusion is completed and no any reaction . Will continue monitor the pt.

## 2020-12-12 NOTE — Progress Notes (Signed)
Initial Nutrition Assessment  DOCUMENTATION CODES:   Not applicable  INTERVENTION:  - liberalize diet from Renal to 2 gram Na. - will order 1 tablet multivitamin with minerals/day.    NUTRITION DIAGNOSIS:   Increased nutrient needs related to acute illness, chronic illness as evidenced by estimated needs.  GOAL:   Patient will meet greater than or equal to 90% of their needs  MONITOR:   PO intake, Labs, Weight trends  REASON FOR ASSESSMENT:   Malnutrition Screening Tool  ASSESSMENT:   65 year old male with medical history of HTN and ANCA positive vasculitis s/p L kidney biopsy 12/07/20. He presented to the ED due to dysuria, hematuria, passing blood clots, and L flank pain. An ultrasound showed large hematoma in the bladder. He was also noted to have mild L hydronephrosis.  Patient sitting up in bed with no family or visitors present. He reports that for 5 months he was experiencing decreased appetite, decreased oral intake, and weight loss. In part due to ongoing flank pain. Pain today is fairly well controlled. He was able to eat 100% of breakfast yesterday and reports that he ate nearly 100% of both meals so far today.   Patient states that he previously weighed ~230 lb and that he dropped down to ~203 lb. He feels he has gained a few pounds from that and is happy with his current weight and is hopeful not to regain all of the weight that he lost.   He lives with his wife and they are mindful of food choices. They do not have salt or sugar in their home. For awhile while appetite was poor patient was on "an ice cream kick" but reports even then he would purchase low-sugar, no sugar added options.  Weight on 8/23 was 208 lb, weight on 7/31 was 204 lb, and weight at [facility] on 11/29/20 was 203 lb.    Labs reviewed; BUN: 40 mg/dl, creatinine: 2.71 mg/dl, Ca: 8.4 mg/dl, GFR: 25 ml/min. Medications reviewed; 100 mg colace BID, 20 mg IV pepcid x1 dose 8/25, 20 mg oral pepcid/day,  325 mg ferrous sulfate/day, 60 mg deltasone/day. IVF; LR @ 75 ml/hr.     NUTRITION - FOCUSED PHYSICAL EXAM:  Completed; no muscle or fat depletions noted.   Diet Order:   Diet Order             Diet 2 gram sodium Room service appropriate? Yes; Fluid consistency: Thin  Diet effective now                   EDUCATION NEEDS:   No education needs have been identified at this time  Skin:  Skin Assessment: Reviewed RN Assessment  Last BM:  PTA/unknown  Height:   Ht Readings from Last 1 Encounters:  12/10/20 6' (1.829 m)    Weight:   Wt Readings from Last 1 Encounters:  12/10/20 94.3 kg     Estimated Nutritional Needs:  Kcal:  2100-2300 kcal Protein:  110-125 grams Fluid:  >/= 2.2 L/day      Jarome Matin, MS, RD, LDN, CNSC Inpatient Clinical Dietitian RD pager # available in [facility]  After hours/weekend pager # available in [facility]

## 2020-12-12 NOTE — Progress Notes (Signed)
Urology Inpatient Progress Report          Intv/Subj: No problems with CBI overnight.  Flank pain improving.    Principal Problem:   Bladder outlet obstruction Active Problems:   ANCA-associated vasculitis (HCC)   Essential hypertension  Current Facility-Administered Medications  Medication Dose Route Frequency Provider Last Rate Last Admin   acetaminophen (TYLENOL) tablet 650 mg  650 mg Oral Q6H PRN Karmen Bongo, MD   650 mg at 12/10/20 8182   Or   acetaminophen (TYLENOL) suppository 650 mg  650 mg Rectal Q6H PRN Karmen Bongo, MD       acetaminophen (TYLENOL) tablet 650 mg  650 mg Oral Once Reesa Chew, MD       albuterol (PROVENTIL) (2.5 MG/3ML) 0.083% nebulizer solution 2.5 mg  2.5 mg Nebulization Once PRN Reesa Chew, MD       amLODipine (NORVASC) tablet 5 mg  5 mg Oral BID Roney Jaffe, MD   5 mg at 12/11/20 2248   belladonna-opium (B&O) suppository 16.2-30 mg  1 suppository Rectal Q8H PRN Robley Fries, MD       bisacodyl (DULCOLAX) EC tablet 5 mg  5 mg Oral Daily PRN Karmen Bongo, MD       Chlorhexidine Gluconate Cloth 2 % PADS 6 each  6 each Topical Daily [place], Einar Pheasant, MD   6 each at 12/10/20 1700   cloNIDine (CATAPRES) tablet 0.1 mg  0.1 mg Oral TID PRN Roney Jaffe, MD       diphenhydrAMINE (BENADRYL) capsule 50 mg  50 mg Oral Once Reesa Chew, MD       diphenhydrAMINE (BENADRYL) injection 50 mg  50 mg Intravenous Once PRN Reesa Chew, MD       docusate sodium (COLACE) capsule 100 mg  100 mg Oral BID Karmen Bongo, MD   100 mg at 12/11/20 2248   EPINEPHrine (ADRENALIN) 0.3 mg  0.3 mg Intramuscular Q5 min PRN Reesa Chew, MD       famotidine (PEPCID) IVPB 20 mg premix  20 mg Intravenous Once PRN Reesa Chew, MD       famotidine (PEPCID) tablet 20 mg  20 mg Oral Daily Reesa Chew, MD   20 mg at 12/11/20 9937   ferrous sulfate tablet 325 mg  325 mg Oral Daily Karmen Bongo, MD   325 mg at 12/11/20 0845    hydrALAZINE (APRESOLINE) injection 5 mg  5 mg Intravenous Q4H PRN Karmen Bongo, MD       HYDROcodone-acetaminophen (NORCO/VICODIN) 5-325 MG per tablet 1-2 tablet  1-2 tablet Oral Q6H PRN Karmen Bongo, MD   2 tablet at 12/11/20 2247   HYDROmorphone (DILAUDID) injection 0.5 mg  0.5 mg Intravenous Q2H PRN Karmen Bongo, MD   0.5 mg at 12/11/20 0235   lactated ringers infusion   Intravenous Continuous Karmen Bongo, MD 75 mL/hr at 12/12/20 0325 New Bag at 12/12/20 0325   lidocaine (LMX) 4 % cream   Topical TID PRN Robley Fries, MD   Given at 12/10/20 1625   methocarbamol (ROBAXIN) 500 mg in dextrose 5 % 50 mL IVPB  500 mg Intravenous Q6H PRN Karmen Bongo, MD       methylPREDNISolone sodium succinate (SOLU-MEDROL) 125 mg/2 mL injection 125 mg  125 mg Intravenous Once PRN Reesa Chew, MD       ondansetron [facility]) tablet 4 mg  4 mg Oral Q6H PRN Karmen Bongo, MD       Or  ondansetron (ZOFRAN) injection 4 mg  4 mg Intravenous Q6H PRN Karmen Bongo, MD       oxybutynin (DITROPAN) tablet 5 mg  5 mg Oral Q8H PRN Robley Fries, MD       polyethylene glycol (MIRALAX / GLYCOLAX) packet 17 g  17 g Oral Daily PRN Karmen Bongo, MD   17 g at 12/11/20 1304   predniSONE (DELTASONE) tablet 60 mg  60 mg Oral Q breakfast Karmen Bongo, MD   60 mg at 12/11/20 0846   riTUXimab (RITUXAN) 1,000 mg in sodium chloride 0.9 % 250 mL (4 mg/mL) infusion  1,000 mg Intravenous Once Reesa Chew, MD       sodium chloride 0.9 % bolus 1,000 mL  1,000 mL Intravenous Once PRN Reesa Chew, MD       sodium chloride irrigation 0.9 % 3,000 mL  3,000 mL Irrigation Continuous Karmen Bongo, MD 0 mL/hr at 12/10/20 0844 3,000 mL at 12/12/20 0331   sulfamethoxazole-trimethoprim (BACTRIM DS) 800-160 MG per tablet 1 tablet  1 tablet Oral Once per day on Mon Wed Fri Reesa Chew, MD   1 tablet at 12/11/20 0844     Objective: Vital: Vitals:   12/11/20 0602 12/11/20 1329 12/11/20 2226  12/12/20 0510  BP: (!) 143/80 (!) 147/75 (!) 124/96 (!) 162/83  Pulse: 69 74 75 72  Resp: 18 16    Temp: 98.4 F (36.9 C) 98.1 F (36.7 C) 98.3 F (36.8 C) 98.4 F (36.9 C)  TempSrc: Oral Oral Oral Oral  SpO2: 100% 100% 99% 100%  Weight:      Height:       I/Os: I/O last 3 completed shifts: In: 12813.9 [P.O.:1440; I.V.:1693.9; Other:9680] Out: 28350 [Urine:28350]  Physical Exam:  General: Patient is in no apparent distress Lungs: Normal respiratory effort, chest expands symmetrically. GI: The abdomen is soft and nontender JP drain with serosanguinous drainage Foley: 24Fr 3 way foley draining clear yellow urine on moderate-slow drip  Ext: lower extremities symmetric  Lab Results: Recent Labs    12/10/20 1635 12/11/20 0505 12/11/20 1911 12/12/20 0435  WBC 16.8* 16.3*  --  12.5*  HGB 9.3* 8.2* 8.7* 7.9*  HCT 30.2* 25.9* 27.3* 25.2*   Recent Labs    12/10/20 0317 12/11/20 0505 12/12/20 0435  NA 132* 136 136  K 4.4 4.4 4.4  CL 100 104 105  CO2 25 24 25   GLUCOSE 115* 114* 100*  BUN 42* 44* 40*  CREATININE 2.22* 2.65* 2.71*  CALCIUM 8.5* 8.3* 8.4*   No results for input(s): LABPT, INR in the last 72 hours. No results for input(s): LABURIN in the last 72 hours. Results for orders placed or performed during the hospital encounter of 12/10/20  Resp Panel by RT-PCR (Flu A&B, Covid) Nasopharyngeal Swab     Status: None   Collection Time: 12/10/20  4:51 AM   Specimen: Nasopharyngeal Swab; Nasopharyngeal(NP) swabs in vial transport medium  Result Value Ref Range Status   SARS Coronavirus 2 by RT PCR NEGATIVE NEGATIVE Final    Comment: (NOTE) SARS-CoV-2 target nucleic acids are NOT DETECTED.  The SARS-CoV-2 RNA is generally detectable in upper respiratory specimens during the acute phase of infection. The lowest concentration of SARS-CoV-2 viral copies this assay can detect is 138 copies/mL. A negative result does not preclude SARS-Cov-2 infection and should not be  used as the sole basis for treatment or other patient management decisions. A negative result may occur with  improper specimen collection/handling, submission  of specimen other than nasopharyngeal swab, presence of viral mutation(s) within the areas targeted by this assay, and inadequate number of viral copies(<138 copies/mL). A negative result must be combined with clinical observations, patient history, and epidemiological information. The expected result is Negative.  Fact Sheet for Patients:  EntrepreneurPulse.com.au  Fact Sheet for Healthcare Providers:  IncredibleEmployment.be  This test is no t yet approved or cleared by the [place] FDA and  has been authorized for detection and/or diagnosis of SARS-CoV-2 by FDA under an Emergency Use Authorization (EUA). This EUA will remain  in effect (meaning this test can be used) for the duration of the COVID-19 declaration under Section 564(b)(1) of the Act, 21 U.S.C.section 360bbb-3(b)(1), unless the authorization is terminated  or revoked sooner.       Influenza A by PCR NEGATIVE NEGATIVE Final   Influenza B by PCR NEGATIVE NEGATIVE Final    Comment: (NOTE) The Xpert Xpress SARS-CoV-2/FLU/RSV plus assay is intended as an aid in the diagnosis of influenza from Nasopharyngeal swab specimens and should not be used as a sole basis for treatment. Nasal washings and aspirates are unacceptable for Xpert Xpress SARS-CoV-2/FLU/RSV testing.  Fact Sheet for Patients: EntrepreneurPulse.com.au  Fact Sheet for Healthcare Providers: IncredibleEmployment.be  This test is not yet approved or cleared by the [place] FDA and has been authorized for detection and/or diagnosis of SARS-CoV-2 by FDA under an Emergency Use Authorization (EUA). This EUA will remain in effect (meaning this test can be used) for the duration of the COVID-19 declaration under Section  564(b)(1) of the Act, 21 U.S.C. section 360bbb-3(b)(1), unless the authorization is terminated or revoked.  Performed at [facility], [place] [address], [place], [place] [address]     Studies/Results: No results found.  Assessment/Plan: Gross hematuria 2/2 renal biopsy  -although Hgb trending down, it's been variable and hematuria has slowed -decreased CBI to slow drip, if clear in 2-3 hours recommend clamping CBI -discussed possible void trial tomorrow if CBI remains clear -CTA not advisable as hematuria improving and renal function worsening   Jacalyn Lefevre, MD Urology 12/12/2020, 7:23 AM

## 2020-12-13 DIAGNOSIS — N320 Bladder-neck obstruction: Secondary | ICD-10-CM | POA: Diagnosis not present

## 2020-12-13 LAB — CBC
HCT: 24.7 % — ABNORMAL LOW (ref 39.0–52.0)
Hemoglobin: 7.7 g/dL — ABNORMAL LOW (ref 13.0–17.0)
MCH: 28 pg (ref 26.0–34.0)
MCHC: 31.2 g/dL (ref 30.0–36.0)
MCV: 89.8 fL (ref 80.0–100.0)
Platelets: 233 10*3/uL (ref 150–400)
RBC: 2.75 MIL/uL — ABNORMAL LOW (ref 4.22–5.81)
RDW: 18.7 % — ABNORMAL HIGH (ref 11.5–15.5)
WBC: 9.6 10*3/uL (ref 4.0–10.5)
nRBC: 0 % (ref 0.0–0.2)

## 2020-12-13 LAB — BASIC METABOLIC PANEL
Anion gap: 6 (ref 5–15)
BUN: 38 mg/dL — ABNORMAL HIGH (ref 8–23)
CO2: 25 mmol/L (ref 22–32)
Calcium: 8.5 mg/dL — ABNORMAL LOW (ref 8.9–10.3)
Chloride: 106 mmol/L (ref 98–111)
Creatinine, Ser: 2.19 mg/dL — ABNORMAL HIGH (ref 0.61–1.24)
GFR, Estimated: 32 mL/min — ABNORMAL LOW (ref >60)
Glucose, Bld: 98 mg/dL (ref 70–99)
Potassium: 4.3 mmol/L (ref 3.5–5.1)
Sodium: 137 mmol/L (ref 135–145)

## 2020-12-13 LAB — HEMOGLOBIN AND HEMATOCRIT, BLOOD
HCT: 27.3 % — ABNORMAL LOW (ref 39.0–52.0)
Hemoglobin: 8.5 g/dL — ABNORMAL LOW (ref 13.0–17.0)

## 2020-12-13 MED ORDER — PREDNISONE 20 MG PO TABS: 60.0000 mg | ORAL_TABLET | Freq: Every day | ORAL | 0 refills | Status: AC

## 2020-12-13 MED ORDER — SULFAMETHOXAZOLE-TRIMETHOPRIM 800-160 MG PO TABS: 1.0000 | ORAL_TABLET | ORAL | 0 refills | Status: AC

## 2020-12-13 MED ORDER — FAMOTIDINE 20 MG PO TABS: 20.0000 mg | ORAL_TABLET | Freq: Every day | ORAL | 0 refills | Status: AC

## 2020-12-13 NOTE — Discharge Summary (Signed)
Sergio Sims  Sergio Sims JKK:[id number] DOB: 1955/08/31 DOA: 12/10/2020  PCP: Pieter Partridge, PA  Admit date: 12/10/2020 Discharge date: 12/13/2020  Admitted From: HOME Disposition:  HOM  Recommendations for Outpatient Follow-up:  Follow up with PCP in 1-2 weeks Please obtain BMP/CBC in one week Follow-up with your nephrology regarding further management of your GN  Home Health:NO  Equipment/Devices: NONE  Discharge Condition: Stable Code Status:   Code Status: Full Code Diet recommendation:  Diet Order             Diet 2 gram sodium Room service appropriate? Yes; Fluid consistency: Thin  Diet effective now                    Brief/Interim Sims: 65 year old male with history of hypertension, ANCA positive vasculitis left kidney biopsy 8/20 presented with dysuria, hematuria passing clots left flank pain.  He had an ultrasound at that site that showed large hematoma in the bladder, was given pain control. Patient was seen in the ED, CT scan showed about 6.4 cm clot in the bladder , some mild bladder distention with mild left hydronephrosis with reported left perinephric stranding.  Foley catheter was placed with continuous irrigation in the ED.  Sodium 132 today glucose 115 creatinine 2.22 BUN 42 GFR 32.  Prior BUN of 56 creatinine 2.95 GFR 23.  Patient appears to have CKD 4.  Baseline H&H 2 weeks ago 8.4 and 26.4 today is 9.1 and 29.3.  Platelets are 359.  COVID and flu are negative.  UA showing red color 50 glucose moderate hemoglobin trace leuks no nitrites.  Zofran, IV fluids and prednisone ordered in the ED.Urology and nephrology were consulted. Also seen by urology underwent CBI subsequently has resolved hematuria has resolved urine has cleared Foley clamped and removed 8/26. Patient underwent induction therapy with rituximab 8/25 and repeat dose in 2 weeks as outpatient.  Creatinine downtrending.  Slight drop in H&H but on repeat has improved to 8.5  g which is his baseline.    Discharge Diagnoses:  Bladder outlet obstruction Gross hematuria with clot Acute blood loss anemia due to hematuria on anemia of chronic disease-baseline 8-9 gm recently 12/03/20 In the setting of recent left renal biopsy with resulting hematoma in the bladder  on [place] 8/19. had biopsy on 8/15. S/p CBI- urine has cleared Foley clamped and removed 8/26 and okay for discharge as per urology. Recent Labs  Lab 12/11/20 0505 12/11/20 1911 12/12/20 0435 12/13/20 0451 12/13/20 1114  HGB 8.2* 8.7* 7.9* 7.7* 8.5*  HCT 25.9* 27.3* 25.2* 24.7* 27.3*   Glomerulonephritis,ANCA-associated vasculitis-with recent biopsy, continue plan per nephrology -discussed discharge this morning and recommends to discharge on prednisone 60 mg, Pepcid, Bactrim 3 times a week gave him prescription for 30 days at least and he will follow-up with nephrology.Marland Kitchen HE IS S/P Induction therapy with rituximab 8/25 and repeat dose in 2 weeks as outpatient.  Leukocytosis suspect in the setting of high dose steroid resolved. Recent Labs  Lab 12/10/20 0932 12/10/20 1635 12/11/20 0505 12/12/20 0435 12/13/20 0451  WBC 18.7* 16.8* 16.3* 12.5* 9.6    AKI on CKD 4 creatinine now improving at 2.19.  Outpatient follow-up with nephro.  He is going to follow-up with nephrology does not want to see one today. Recent Labs    11/15/20 1525 11/16/20 0204 11/17/20 0224 12/01/20 0852 12/02/20 0500 12/03/20 0511 12/10/20 0317 12/11/20 0505 12/12/20 0435 12/13/20 0451  BUN 32* 29* 27* 37* 45* 56* 42*  44* 40* 38*  CREATININE 2.20* 2.31* 2.05* 2.66* 2.50* 2.95* 2.22* 2.65* 2.71* 2.19*    Essential hypertension: Fairly controlled on amlodipine Hyponatremia-resolve Hx of iron-deficiency anemia -cont iron sulfate Overweight:bmi 28.  Consults: Urology Nephrology  Subjective: Alert awake oriented, ambulating.    Discharge Exam: Vitals:   12/12/20 2232 12/13/20 0446  BP: 130/65 133/62  Pulse:  64   Resp:  18  Temp:  98 F (36.7 C)  SpO2:  98%   General: Pt is alert, awake, not in acute distress Cardiovascular: RRR, S1/S2 +, no rubs, no gallops Respiratory: CTA bilaterally, no wheezing, no rhonchi Abdominal: Soft, NT, ND, bowel sounds + Extremities: no edema, no cyanosis  Discharge Instructions  Discharge Instructions     Discharge instructions   Complete by: As directed    Please follow-up with [name] from nephrology- call the office if you do not hear from them by early next week  Check CBC and BMP in 1 week from PCP or your kidney doctor  Please call call MD or return to ER for similar or worsening recurring problem that brought you to hospital or if any fever,nausea/vomiting,abdominal pain, uncontrolled pain, chest pain,  shortness of breath or any other alarming symptoms.  Please follow-up your doctor as instructed in a week time and call the office for appointment.  Please avoid alcohol, smoking, or any other illicit substance and maintain healthy habits including taking your regular medications as prescribed.  You were cared for by a hospitalist during your hospital stay. If you have any questions about your discharge medications or the care you received while you were in the hospital after you are discharged, you can call the unit and ask to speak with the hospitalist on call if the hospitalist that took care of you is not available.  Once you are discharged, your primary care physician will handle any further medical issues. Please note that NO REFILLS for any discharge medications will be authorized once you are discharged, as it is imperative that you return to your primary care physician (or establish a relationship with a primary care physician if you do not have one) for your aftercare needs so that they can reassess your need for medications and monitor your lab values   Increase activity slowly   Complete by: As directed    No wound care   Complete by: As  directed       Allergies as of 12/13/2020   No Known Allergies      Medication List     TAKE these medications    acetaminophen 500 MG tablet Commonly known as: TYLENOL Take 500 mg by mouth every 6 (six) hours as needed for mild pain.   amLODipine 5 MG tablet Commonly known as: NORVASC Take 1 tablet (5 mg total) by mouth daily.   famotidine 20 MG tablet Commonly known as: PEPCID Take 1 tablet (20 mg total) by mouth daily.   ferrous sulfate 325 (65 FE) MG tablet Take 1 tablet (325 mg total) by mouth daily.   HYDROcodone-acetaminophen 5-325 MG tablet Commonly known as: NORCO/VICODIN Take 1-2 tablets by mouth every 6 (six) hours as needed for moderate pain.   predniSONE 20 MG tablet Commonly known as: DELTASONE Take 3 tablets (60 mg total) by mouth daily with breakfast.   sulfamethoxazole-trimethoprim 800-160 MG tablet Commonly known as: BACTRIM DS Take 1 tablet by mouth 3 (three) times a week. Start taking on: December 16, 2020  Follow-up Information     Adrienne Mocha A, PA Follow up in 1 week(s).   Specialty: Family Medicine Contact information: [address] [place] [address] [phone]         Reesa Chew, MD Follow up in 1 week(s).   Specialty: Internal Medicine Contact information: [place] [address] [phone]                No Known Allergies  The results of significant diagnostics from this hospitalization (including imaging, microbiology, ancillary and laboratory) are listed below for reference.    Microbiology: Recent Results (from the past 240 hour(s))  Resp Panel by RT-PCR (Flu A&B, Covid) Nasopharyngeal Swab     Status: None   Collection Time: 12/10/20  4:51 AM   Specimen: Nasopharyngeal Swab; Nasopharyngeal(NP) swabs in vial transport medium  Result Value Ref Range Status   SARS Coronavirus 2 by RT PCR NEGATIVE NEGATIVE Final    Comment: (NOTE) SARS-CoV-2 target nucleic acids are  NOT DETECTED.  The SARS-CoV-2 RNA is generally detectable in upper respiratory specimens during the acute phase of infection. The lowest concentration of SARS-CoV-2 viral copies this assay can detect is 138 copies/mL. A negative result does not preclude SARS-Cov-2 infection and should not be used as the sole basis for treatment or other patient management decisions. A negative result may occur with  improper specimen collection/handling, submission of specimen other than nasopharyngeal swab, presence of viral mutation(s) within the areas targeted by this assay, and inadequate number of viral copies(<138 copies/mL). A negative result must be combined with clinical observations, patient history, and epidemiological information. The expected result is Negative.  Fact Sheet for Patients:  EntrepreneurPulse.com.au  Fact Sheet for Healthcare Providers:  IncredibleEmployment.be  This test is no t yet approved or cleared by the [place] FDA and  has been authorized for detection and/or diagnosis of SARS-CoV-2 by FDA under an Emergency Use Authorization (EUA). This EUA will remain  in effect (meaning this test can be used) for the duration of the COVID-19 declaration under Section 564(b)(1) of the Act, 21 U.S.C.section 360bbb-3(b)(1), unless the authorization is terminated  or revoked sooner.       Influenza A by PCR NEGATIVE NEGATIVE Final   Influenza B by PCR NEGATIVE NEGATIVE Final    Comment: (NOTE) The Xpert Xpress SARS-CoV-2/FLU/RSV plus assay is intended as an aid in the diagnosis of influenza from Nasopharyngeal swab specimens and should not be used as a sole basis for treatment. Nasal washings and aspirates are unacceptable for Xpert Xpress SARS-CoV-2/FLU/RSV testing.  Fact Sheet for Patients: EntrepreneurPulse.com.au  Fact Sheet for Healthcare Providers: IncredibleEmployment.be  This test is not  yet approved or cleared by the [place] FDA and has been authorized for detection and/or diagnosis of SARS-CoV-2 by FDA under an Emergency Use Authorization (EUA). This EUA will remain in effect (meaning this test can be used) for the duration of the COVID-19 declaration under Section 564(b)(1) of the Act, 21 U.S.C. section 360bbb-3(b)(1), unless the authorization is terminated or revoked.  Performed at [facility], [place] [address], [place], [place] [address]     Procedures/Studies: DG Chest 2 View  Result Date: 11/15/2020 CLINICAL DATA:  Recent infection EXAM: CHEST - 2 VIEW COMPARISON:  CT 09/27/2020 FINDINGS: Previously seen small nodules by CT are not well visualized by plain film and should be followed by CT. Heart is normal size. Probable scarring in the lung bases. Small right pleural effusion again suspected,  unchanged. IMPRESSION: Bibasilar opacities, right greater than left, favor scarring. Small right pleural effusion. Previously seen small pulmonary nodules not well visualized by plain films and can be followed with CT as clinically indicated. Electronically Signed   By: Rolm Baptise M.D.   On: 11/15/2020 19:07   US RENAL  Result Date: 12/06/2020 CLINICAL DATA:  Renal dysfunction. History of LEFT kidney biopsy with blood in urine. EXAM: RENAL / URINARY TRACT ULTRASOUND COMPLETE COMPARISON:  November 15, 2020. FINDINGS: Right Kidney: Renal measurements: 12.5 x 6.0 x 6.2 cm = volume: 242.2 mL. Mildly increased echogenicity of the RIGHT kidney. Renal cysts. Largest renal cyst in the lower pole measuring approximately 2.2 x 1.9 x 2.2 cm. No hydronephrosis. Left Kidney: Renal measurements: 12.9 x 6.5 x 6.0 cm. = volume: 242 mL. Increased cortical echogenicity with diminished corticomedullary differentiation. No perinephric fluid or gross hematoma adjacent to the kidney. Mild fullness of collecting systems perhaps slightly decreased following voiding Bladder: Large  filling defect in the urinary bladder measuring 7.7 x 7.4 x 9.4 cm without visible flow and new compared to the recent study from November 15, 2020. Other: None. IMPRESSION: Large hematoma in the urinary bladder as described. Mild fullness of LEFT collecting systems slightly diminished following voiding, no frank hydronephrosis. No perinephric fluid collection visible on ultrasound. Given constellation of above findings urologic consultation may be helpful particularly if there is renal colic and signs of ongoing bleeding. Renal cysts on the RIGHT. These results will be called to the ordering clinician or representative by the Radiologist Assistant, and communication documented in the PACS or [employer]. Electronically Signed   By: Zetta Bills M.D.   On: 12/06/2020 15:52   US Renal  Result Date: 11/15/2020 CLINICAL DATA:  Renal failure. EXAM: RENAL / URINARY TRACT ULTRASOUND COMPLETE COMPARISON:  None. FINDINGS: Right Kidney: Renal measurements: 12.8 x 6.6 x 6.0 cm = volume: 262.7 mL. Echogenicity appears increased. Two simple cysts are seen measuring 1.0 and 2.3 cm. No solid mass or hydronephrosis visualized. Left Kidney: Renal measurements: 12.1 x 6.8 x 6.4 cm = volume: 276 mL. Echogenicity appears increased. 0.7 cm cyst noted. No solid mass or hydronephrosis visualized. Bladder: Appears normal for degree of bladder distention. Other: Prostate gland appears enlarged. IMPRESSION: Negative for hydronephrosis or acute abnormality. Increased cortical echogenicity compatible with medical renal disease. Electronically Signed   By: Inge Rise M.D.   On: 11/15/2020 18:54   CT Renal Stone Study  Result Date: 12/10/2020 CLINICAL DATA:  Hematuria following left kidney biopsy. Dysuria with passage of clots. Large bladder clot noted on bladder sonogram. Leukocytosis EXAM: CT ABDOMEN AND PELVIS WITHOUT CONTRAST TECHNIQUE: Multidetector CT imaging of the abdomen and pelvis was performed following the standard  protocol without IV contrast. COMPARISON:  None. FINDINGS: Lower chest: Right basilar pleural thickening and associated posterobasal right lower lobe parenchymal scarring noted. The visualized heart and pericardium are unremarkable. Hepatobiliary: No focal liver abnormality is seen. No gallstones, gallbladder wall thickening, or biliary dilatation. Pancreas: Unremarkable Spleen: Unremarkable Adrenals/Urinary Tract: The adrenal glands are unremarkable. The kidneys are normal in size and position. There is mild left hydronephrosis and moderate left perinephric stranding. No subcapsular hematoma or perirenal fluid collection is identified. Minimal nonspecific right perinephric stranding noted. Exophytic simple cortical cyst arises from the lower pole of the right kidney. No nephro or urolithiasis. The bladder is distended with a dependently layering blood clot measuring 6.4 x 4.6 x 6.3 cm. Stomach/Bowel: Stomach is within normal limits. Appendix appears normal.  No evidence of bowel wall thickening, distention, or inflammatory changes. No free intraperitoneal gas or fluid. Vascular/Lymphatic: Moderate aortoiliac atherosclerotic calcification. No aortic aneurysm. No pathologic adenopathy within the abdomen and pelvis. Reproductive: Prostate is unremarkable. Other: Tiny bilateral fat containing inguinal hernias. Rectum unremarkable. Musculoskeletal: Degenerative changes are seen within the lumbar spine. No acute bone abnormality. No lytic or blastic bone lesion. IMPRESSION: 6.4 cm dependently layering blood clot within the bladder lumen. Mild bladder distension suggests some degree of bladder outlet obstruction. Mild left hydronephrosis and moderate left perinephric stranding noted. No subcapsular hematoma or pararenal fluid collection or hematoma identified, however. Note that the presence of an underlying arteriovenous fistula, pseudoaneurysm, or extravasation into the collecting system itself is not well assessed in  absence of contrast administration Aortic Atherosclerosis (ICD10-I70.0). Electronically Signed   By: Fidela Salisbury M.D.   On: 12/10/2020 04:29   US BIOPSY (KIDNEY)  Result Date: 12/02/2020 INDICATION: Acute renal failure EXAM: Ultrasound-guided random renal biopsy MEDICATIONS: None. ANESTHESIA/SEDATION: Moderate (conscious) sedation was employed during this procedure. A total of Versed 2 mg and Fentanyl 50 mcg was administered intravenously. Moderate Sedation Time: 15 minutes. The patient's level of consciousness and vital signs were monitored continuously by radiology nursing throughout the procedure under my direct supervision. FLUOROSCOPY TIME:  N/a COMPLICATIONS: None immediate. PROCEDURE: Informed written consent was obtained from the patient after a thorough discussion of the procedural risks, benefits and alternatives. All questions were addressed. Maximal Sterile Barrier Technique was utilized including caps, mask, sterile gowns, sterile gloves, sterile drape, hand hygiene and skin antiseptic. A timeout was performed prior to the initiation of the procedure. The patient was placed prone on the exam table. Limited ultrasound of the bilateral flanks US performed for planning purposes. Appropriate biopsy location was selected of the left renal lower pole, and the overlying skin was prepped and draped in the standard sterile fashion. Local analgesia was obtained with 1% lidocaine. Using ultrasound guidance, a 15 gauge introducer needle was advanced just deep to the cortex of the left renal lower pole. Subsequently, core needle biopsy was performed using a 16 gauge core biopsy device x2 passes. Specimens were submitted in saline for further healing. Gel-Foam slurry was administered through the introducer needle as it was removed. Limited postprocedure imaging demonstrated no surrounding hematoma. A clean dressing was placed. The patient tolerated the procedure well without immediate complication IMPRESSION:  Successful ultrasound-guided random renal biopsy of the left renal lower pole. Electronically Signed   By: Albin Felling M.D.   On: 12/02/2020 16:01    Labs: BNP (last 3 results) No results for input(s): BNP in the last 8760 hours. Basic Metabolic Panel: Recent Labs  Lab 12/10/20 0317 12/11/20 0505 12/12/20 0435 12/13/20 0451  NA 132* 136 136 137  K 4.4 4.4 4.4 4.3  CL 100 104 105 106  CO2 25 24 25 25   GLUCOSE 115* 114* 100* 98  BUN 42* 44* 40* 38*  CREATININE 2.22* 2.65* 2.71* 2.19*  CALCIUM 8.5* 8.3* 8.4* 8.5*   Liver Function Tests: No results for input(s): AST, ALT, ALKPHOS, BILITOT, PROT, ALBUMIN in the last 168 hours. No results for input(s): LIPASE, AMYLASE in the last 168 hours. No results for input(s): AMMONIA in the last 168 hours. CBC: Recent Labs  Lab 12/10/20 0317 12/10/20 0932 12/10/20 1635 12/11/20 0505 12/11/20 1911 12/12/20 0435 12/13/20 0451 12/13/20 1114  WBC 19.1* 18.7* 16.8* 16.3*  --  12.5* 9.6  --   NEUTROABS 16.2*  --   --   --   --   --   --   --  HGB 9.1* 9.1* 9.3* 8.2* 8.7* 7.9* 7.7* 8.5*  HCT 29.3* 29.3* 30.2* 25.9* 27.3* 25.2* 24.7* 27.3*  MCV 87.7 87.7 89.3 88.7  --  89.7 89.8  --   PLT 359 363 323 290  --  256 233  --    Cardiac Enzymes: No results for input(s): CKTOTAL, CKMB, CKMBINDEX, TROPONINI in the last 168 hours. BNP: Invalid input(s): POCBNP CBG: No results for input(s): GLUCAP in the last 168 hours. D-Dimer No results for input(s): DDIMER in the last 72 hours. Hgb A1c No results for input(s): HGBA1C in the last 72 hours. Lipid Profile No results for input(s): CHOL, HDL, LDLCALC, TRIG, CHOLHDL, LDLDIRECT in the last 72 hours. Thyroid function studies No results for input(s): TSH, T4TOTAL, T3FREE, THYROIDAB in the last 72 hours.  Invalid input(s): FREET3 Anemia work up No results for input(s): VITAMINB12, FOLATE, FERRITIN, TIBC, IRON, RETICCTPCT in the last 72 hours. Urinalysis    Component Value Date/Time    COLORURINE RED (A) 12/10/2020 0611   APPEARANCEUR HAZY (A) 12/10/2020 0611   LABSPEC 1.010 12/10/2020 0611   PHURINE 6.0 12/10/2020 0611   GLUCOSEU 50 (A) 12/10/2020 0611   HGBUR MODERATE (A) 12/10/2020 0611   BILIRUBINUR NEGATIVE 12/10/2020 0611   KETONESUR NEGATIVE 12/10/2020 0611   PROTEINUR 100 (A) 12/10/2020 0611   NITRITE NEGATIVE 12/10/2020 0611   LEUKOCYTESUR TRACE (A) 12/10/2020 0611   Sepsis Labs Invalid input(s): PROCALCITONIN,  WBC,  LACTICIDVEN Microbiology Recent Results (from the past 240 hour(s))  Resp Panel by RT-PCR (Flu A&B, Covid) Nasopharyngeal Swab     Status: None   Collection Time: 12/10/20  4:51 AM   Specimen: Nasopharyngeal Swab; Nasopharyngeal(NP) swabs in vial transport medium  Result Value Ref Range Status   SARS Coronavirus 2 by RT PCR NEGATIVE NEGATIVE Final    Comment: (NOTE) SARS-CoV-2 target nucleic acids are NOT DETECTED.  The SARS-CoV-2 RNA is generally detectable in upper respiratory specimens during the acute phase of infection. The lowest concentration of SARS-CoV-2 viral copies this assay can detect is 138 copies/mL. A negative result does not preclude SARS-Cov-2 infection and should not be used as the sole basis for treatment or other patient management decisions. A negative result may occur with  improper specimen collection/handling, submission of specimen other than nasopharyngeal swab, presence of viral mutation(s) within the areas targeted by this assay, and inadequate number of viral copies(<138 copies/mL). A negative result must be combined with clinical observations, patient history, and epidemiological information. The expected result is Negative.  Fact Sheet for Patients:  EntrepreneurPulse.com.au  Fact Sheet for Healthcare Providers:  IncredibleEmployment.be  This test is no t yet approved or cleared by the [place] FDA and  has been authorized for detection and/or diagnosis of  SARS-CoV-2 by FDA under an Emergency Use Authorization (EUA). This EUA will remain  in effect (meaning this test can be used) for the duration of the COVID-19 declaration under Section 564(b)(1) of the Act, 21 U.S.C.section 360bbb-3(b)(1), unless the authorization is terminated  or revoked sooner.       Influenza A by PCR NEGATIVE NEGATIVE Final   Influenza B by PCR NEGATIVE NEGATIVE Final    Comment: (NOTE) The Xpert Xpress SARS-CoV-2/FLU/RSV plus assay is intended as an aid in the diagnosis of influenza from Nasopharyngeal swab specimens and should not be used as a sole basis for treatment. Nasal washings and aspirates are unacceptable for Xpert Xpress SARS-CoV-2/FLU/RSV testing.  Fact Sheet for Patients: EntrepreneurPulse.com.au  Fact Sheet for Healthcare Providers: IncredibleEmployment.be  This test is not yet approved or cleared by the [place] and has been authorized for detection and/or diagnosis of SARS-CoV-2 by FDA under an Emergency Use Authorization (EUA). This EUA will remain in effect (meaning this test can be used) for the duration of the COVID-19 declaration under Section 564(b)(1) of the Act, 21 U.S.C. section 360bbb-3(b)(1), unless the authorization is terminated or revoked.  Performed at [facility], [place] [address] [place], [place] [address]      Time coordinating discharge: 35 minutes  SIGNED: Antonieta Pert, MD  [facility] 12/13/2020, 12:05 PM  If 7PM-7AM, please contact night-coverage [email or web address]

## 2020-12-13 NOTE — Progress Notes (Signed)
Urology Inpatient Progress Report    Intv/Subj: No overnight events.  Patient CBI running on slow drip.  Principal Problem:   Bladder outlet obstruction Active Problems:   ANCA-associated vasculitis (HCC)   Essential hypertension  Current Facility-Administered Medications  Medication Dose Route Frequency Provider Last Rate Last Admin   acetaminophen (TYLENOL) tablet 650 mg  650 mg Oral Q6H PRN Karmen Bongo, MD   650 mg at 12/12/20 8841   Or   acetaminophen (TYLENOL) suppository 650 mg  650 mg Rectal Q6H PRN Karmen Bongo, MD       albuterol (PROVENTIL) (2.5 MG/3ML) 0.083% nebulizer solution 2.5 mg  2.5 mg Nebulization Once PRN Reesa Chew, MD       amLODipine (NORVASC) tablet 5 mg  5 mg Oral BID Roney Jaffe, MD   5 mg at 12/12/20 2232   belladonna-opium (B&O) suppository 16.2-30 mg  1 suppository Rectal Q8H PRN Robley Fries, MD       bisacodyl (DULCOLAX) EC tablet 5 mg  5 mg Oral Daily PRN Karmen Bongo, MD       Chlorhexidine Gluconate Cloth 2 % PADS 6 each  6 each Topical Daily [place], Einar Pheasant, MD   6 each at 12/12/20 1226   cloNIDine (CATAPRES) tablet 0.1 mg  0.1 mg Oral TID PRN Roney Jaffe, MD   0.1 mg at 12/12/20 1312   diphenhydrAMINE (BENADRYL) injection 50 mg  50 mg Intravenous Once PRN Reesa Chew, MD       docusate sodium (COLACE) capsule 100 mg  100 mg Oral BID Karmen Bongo, MD   100 mg at 12/12/20 2232   EPINEPHrine (ADRENALIN) 0.3 mg  0.3 mg Intramuscular Q5 min PRN Reesa Chew, MD       famotidine (PEPCID) IVPB 20 mg premix  20 mg Intravenous Once PRN Reesa Chew, MD       famotidine (PEPCID) tablet 20 mg  20 mg Oral Daily Reesa Chew, MD   20 mg at 12/12/20 6606   ferrous sulfate tablet 325 mg  325 mg Oral Daily Karmen Bongo, MD   325 mg at 12/12/20 3016   hydrALAZINE (APRESOLINE) injection 5 mg  5 mg Intravenous Q4H PRN Karmen Bongo, MD       HYDROcodone-acetaminophen (NORCO/VICODIN) 5-325 MG per tablet 1-2  tablet  1-2 tablet Oral Q6H PRN Karmen Bongo, MD   2 tablet at 12/12/20 0805   HYDROmorphone (DILAUDID) injection 0.5 mg  0.5 mg Intravenous Q2H PRN Karmen Bongo, MD   0.5 mg at 12/11/20 0235   lactated ringers infusion   Intravenous Continuous Karmen Bongo, MD 75 mL/hr at 12/12/20 0325 New Bag at 12/12/20 0325   lidocaine (LMX) 4 % cream   Topical TID PRN Robley Fries, MD   Given at 12/10/20 1625   methocarbamol (ROBAXIN) 500 mg in dextrose 5 % 50 mL IVPB  500 mg Intravenous Q6H PRN Karmen Bongo, MD       methylPREDNISolone sodium succinate (SOLU-MEDROL) 125 mg/2 mL injection 125 mg  125 mg Intravenous Once PRN Reesa Chew, MD       multivitamin with minerals tablet 1 tablet  1 tablet Oral Daily Kc, Ramesh, MD   1 tablet at 12/12/20 1751   ondansetron (ZOFRAN) tablet 4 mg  4 mg Oral Q6H PRN Karmen Bongo, MD       Or   ondansetron [facility]) injection 4 mg  4 mg Intravenous Q6H PRN Karmen Bongo, MD  oxybutynin (DITROPAN) tablet 5 mg  5 mg Oral Q8H PRN Jacalyn Lefevre D, MD       pantoprazole (PROTONIX) EC tablet 40 mg  40 mg Oral Daily Kc, Ramesh, MD       polyethylene glycol (MIRALAX / GLYCOLAX) packet 17 g  17 g Oral Daily PRN Karmen Bongo, MD   17 g at 12/11/20 1304   predniSONE (DELTASONE) tablet 60 mg  60 mg Oral Q breakfast Karmen Bongo, MD   60 mg at 12/12/20 2025   sodium chloride 0.9 % bolus 1,000 mL  1,000 mL Intravenous Once PRN Reesa Chew, MD       sodium chloride irrigation 0.9 % 3,000 mL  3,000 mL Irrigation Continuous Karmen Bongo, MD 0 mL/hr at 12/10/20 0844 3,000 mL at 12/13/20 0309   sulfamethoxazole-trimethoprim (BACTRIM DS) 800-160 MG per tablet 1 tablet  1 tablet Oral Once per day on Mon Wed Fri Reesa Chew, MD   1 tablet at 12/11/20 0844     Objective: Vital: Vitals:   12/12/20 1804 12/12/20 2000 12/12/20 2232 12/13/20 0446  BP: 127/74 127/66 130/65 133/62  Pulse: 73 64  64  Resp: 18   18  Temp: (!) 97.2 F (36.2  C) (!) 97.3 F (36.3 C)  98 F (36.7 C)  TempSrc: Oral Oral  Oral  SpO2: 100% 99%  98%  Weight:      Height:       I/Os: I/O last 3 completed shifts: In: 12164.1 [P.O.:1820; I.V.:1294.1; Other:9050] Out: 42706 [Urine:26425]  Physical Exam:  General: Patient is in no apparent distress Lungs: Normal respiratory effort, chest expands symmetrically. GI:  abdomen is soft and nontender  Foley: 24Fr 3 way foley with clear yellow urine and tiny old clot in tubing on very slow drip  Ext: lower extremities symmetric  Lab Results: Recent Labs    12/11/20 0505 12/11/20 1911 12/12/20 0435 12/13/20 0451  WBC 16.3*  --  12.5* 9.6  HGB 8.2* 8.7* 7.9* 7.7*  HCT 25.9* 27.3* 25.2* 24.7*   Recent Labs    12/11/20 0505 12/12/20 0435 12/13/20 0451  NA 136 136 137  K 4.4 4.4 4.3  CL 104 105 106  CO2 24 25 25   GLUCOSE 114* 100* 98  BUN 44* 40* 38*  CREATININE 2.65* 2.71* 2.19*  CALCIUM 8.3* 8.4* 8.5*   No results for input(s): LABPT, INR in the last 72 hours. No results for input(s): LABURIN in the last 72 hours. Results for orders placed or performed during the hospital encounter of 12/10/20  Resp Panel by RT-PCR (Flu A&B, Covid) Nasopharyngeal Swab     Status: None   Collection Time: 12/10/20  4:51 AM   Specimen: Nasopharyngeal Swab; Nasopharyngeal(NP) swabs in vial transport medium  Result Value Ref Range Status   SARS Coronavirus 2 by RT PCR NEGATIVE NEGATIVE Final    Comment: (NOTE) SARS-CoV-2 target nucleic acids are NOT DETECTED.  The SARS-CoV-2 RNA is generally detectable in upper respiratory specimens during the acute phase of infection. The lowest concentration of SARS-CoV-2 viral copies this assay can detect is 138 copies/mL. A negative result does not preclude SARS-Cov-2 infection and should not be used as the sole basis for treatment or other patient management decisions. A negative result may occur with  improper specimen collection/handling, submission of  specimen other than nasopharyngeal swab, presence of viral mutation(s) within the areas targeted by this assay, and inadequate number of viral copies(<138 copies/mL). A negative result must be combined with  clinical observations, patient history, and epidemiological information. The expected result is Negative.  Fact Sheet for Patients:  EntrepreneurPulse.com.au  Fact Sheet for Healthcare Providers:  IncredibleEmployment.be  This test is no t yet approved or cleared by the [place] FDA and  has been authorized for detection and/or diagnosis of SARS-CoV-2 by FDA under an Emergency Use Authorization (EUA). This EUA will remain  in effect (meaning this test can be used) for the duration of the COVID-19 declaration under Section 564(b)(1) of the Act, 21 U.S.C.section 360bbb-3(b)(1), unless the authorization is terminated  or revoked sooner.       Influenza A by PCR NEGATIVE NEGATIVE Final   Influenza B by PCR NEGATIVE NEGATIVE Final    Comment: (NOTE) The Xpert Xpress SARS-CoV-2/FLU/RSV plus assay is intended as an aid in the diagnosis of influenza from Nasopharyngeal swab specimens and should not be used as a sole basis for treatment. Nasal washings and aspirates are unacceptable for Xpert Xpress SARS-CoV-2/FLU/RSV testing.  Fact Sheet for Patients: EntrepreneurPulse.com.au  Fact Sheet for Healthcare Providers: IncredibleEmployment.be  This test is not yet approved or cleared by the [place] FDA and has been authorized for detection and/or diagnosis of SARS-CoV-2 by FDA under an Emergency Use Authorization (EUA). This EUA will remain in effect (meaning this test can be used) for the duration of the COVID-19 declaration under Section 564(b)(1) of the Act, 21 U.S.C. section 360bbb-3(b)(1), unless the authorization is terminated or revoked.  Performed at [facility], [place]  [address], [place], [place] [address]     Studies/Results: No results found.  Assessment/Plan: Gross hematuria secondary to renal biopsy: -Hemoglobin stable and creatinine improved -Urine has cleared and CBI was clamped and Foley catheter removed this morning -Please ensure patient voids by 1:30 PM and if not do bladder scan -Patient will need outpatient urology follow-up at [facility] urology [phone]   Jacalyn Lefevre MD Urology 12/13/2020, 8:32 AM

## 2020-12-13 NOTE — Care Management Important Message (Signed)
Medicare IM printed for [employer] to give to the patient. 

## 2020-12-16 DIAGNOSIS — R319 Hematuria, unspecified: Secondary | ICD-10-CM | POA: Diagnosis not present

## 2020-12-16 DIAGNOSIS — I776 Arteritis, unspecified: Secondary | ICD-10-CM | POA: Diagnosis not present

## 2020-12-16 DIAGNOSIS — I10 Essential (primary) hypertension: Secondary | ICD-10-CM | POA: Diagnosis not present

## 2020-12-24 DIAGNOSIS — N138 Other obstructive and reflux uropathy: Secondary | ICD-10-CM | POA: Diagnosis not present

## 2020-12-24 DIAGNOSIS — R310 Gross hematuria: Secondary | ICD-10-CM | POA: Diagnosis not present

## 2020-12-25 ENCOUNTER — Other Ambulatory Visit (HOSPITAL_COMMUNITY): Payer: Self-pay | Admitting: *Deleted

## 2020-12-26 ENCOUNTER — Ambulatory Visit (HOSPITAL_COMMUNITY)
Admission: RE | Admit: 2020-12-26 | Discharge: 2020-12-26 | Disposition: A | Discharge status: Home / Self Care | Payer: Medicare HMO | Source: Ambulatory Visit | Attending: Internal Medicine | Admitting: Internal Medicine

## 2020-12-26 ENCOUNTER — Other Ambulatory Visit: Payer: Self-pay

## 2020-12-26 DIAGNOSIS — I776 Arteritis, unspecified: Secondary | ICD-10-CM | POA: Insufficient documentation

## 2020-12-26 MED ORDER — DIPHENHYDRAMINE HCL 50 MG/ML IJ SOLN
INTRAMUSCULAR | Status: AC
  Filled 2020-12-26: qty 1

## 2020-12-26 MED ORDER — SODIUM CHLORIDE 0.9 % IV SOLN
1000.0000 mg | Freq: Once | INTRAVENOUS | Status: AC
  Administered 2020-12-26: 1000 mg via INTRAVENOUS
  Filled 2020-12-26: qty 100

## 2020-12-26 MED ORDER — METHYLPREDNISOLONE SODIUM SUCC 125 MG IJ SOLR
INTRAMUSCULAR | Status: AC
  Filled 2020-12-26: qty 2

## 2020-12-26 MED ORDER — METHYLPREDNISOLONE SODIUM SUCC 125 MG IJ SOLR
125.0000 mg | Freq: Once | INTRAMUSCULAR | Status: AC
  Administered 2020-12-26: 125 mg via INTRAVENOUS

## 2020-12-26 MED ORDER — ACETAMINOPHEN 325 MG PO TABS
ORAL_TABLET | ORAL | Status: AC
  Filled 2020-12-26: qty 2

## 2020-12-26 MED ORDER — DIPHENHYDRAMINE HCL 50 MG/ML IJ SOLN
25.0000 mg | Freq: Once | INTRAMUSCULAR | Status: AC
  Administered 2020-12-26: 25 mg via INTRAVENOUS

## 2020-12-26 MED ORDER — ACETAMINOPHEN 325 MG PO TABS
650.0000 mg | ORAL_TABLET | Freq: Once | ORAL | Status: AC
  Administered 2020-12-26: 650 mg via ORAL

## 2020-12-27 DIAGNOSIS — R918 Other nonspecific abnormal finding of lung field: Secondary | ICD-10-CM | POA: Diagnosis not present

## 2020-12-27 DIAGNOSIS — M3130 Wegener's granulomatosis without renal involvement: Secondary | ICD-10-CM | POA: Diagnosis not present

## 2020-12-27 DIAGNOSIS — N3289 Other specified disorders of bladder: Secondary | ICD-10-CM | POA: Diagnosis not present

## 2020-12-27 DIAGNOSIS — I129 Hypertensive chronic kidney disease with stage 1 through stage 4 chronic kidney disease, or unspecified chronic kidney disease: Secondary | ICD-10-CM | POA: Diagnosis not present

## 2020-12-27 DIAGNOSIS — N189 Chronic kidney disease, unspecified: Secondary | ICD-10-CM | POA: Diagnosis not present

## 2020-12-27 DIAGNOSIS — R252 Cramp and spasm: Secondary | ICD-10-CM | POA: Diagnosis not present

## 2020-12-27 DIAGNOSIS — D631 Anemia in chronic kidney disease: Secondary | ICD-10-CM | POA: Diagnosis not present

## 2021-01-07 DIAGNOSIS — R310 Gross hematuria: Secondary | ICD-10-CM | POA: Diagnosis not present

## 2021-01-07 DIAGNOSIS — N138 Other obstructive and reflux uropathy: Secondary | ICD-10-CM | POA: Diagnosis not present

## 2021-01-16 DIAGNOSIS — H9201 Otalgia, right ear: Secondary | ICD-10-CM | POA: Diagnosis not present

## 2021-01-16 DIAGNOSIS — I10 Essential (primary) hypertension: Secondary | ICD-10-CM | POA: Diagnosis not present

## 2021-01-16 DIAGNOSIS — I776 Arteritis, unspecified: Secondary | ICD-10-CM | POA: Diagnosis not present

## 2021-01-16 DIAGNOSIS — Z23 Encounter for immunization: Secondary | ICD-10-CM | POA: Diagnosis not present

## 2021-01-16 DIAGNOSIS — G629 Polyneuropathy, unspecified: Secondary | ICD-10-CM | POA: Diagnosis not present

## 2021-01-22 DIAGNOSIS — N179 Acute kidney failure, unspecified: Secondary | ICD-10-CM | POA: Diagnosis not present

## 2021-01-22 DIAGNOSIS — N189 Chronic kidney disease, unspecified: Secondary | ICD-10-CM | POA: Diagnosis not present

## 2021-01-27 DIAGNOSIS — N189 Chronic kidney disease, unspecified: Secondary | ICD-10-CM | POA: Diagnosis not present

## 2021-01-27 DIAGNOSIS — I129 Hypertensive chronic kidney disease with stage 1 through stage 4 chronic kidney disease, or unspecified chronic kidney disease: Secondary | ICD-10-CM | POA: Diagnosis not present

## 2021-01-27 DIAGNOSIS — D631 Anemia in chronic kidney disease: Secondary | ICD-10-CM | POA: Diagnosis not present

## 2021-01-27 DIAGNOSIS — R918 Other nonspecific abnormal finding of lung field: Secondary | ICD-10-CM | POA: Diagnosis not present

## 2021-01-27 DIAGNOSIS — M3130 Wegener's granulomatosis without renal involvement: Secondary | ICD-10-CM | POA: Diagnosis not present

## 2021-01-27 DIAGNOSIS — N3289 Other specified disorders of bladder: Secondary | ICD-10-CM | POA: Diagnosis not present

## 2021-02-19 DIAGNOSIS — N059 Unspecified nephritic syndrome with unspecified morphologic changes: Secondary | ICD-10-CM | POA: Diagnosis not present

## 2021-03-07 DIAGNOSIS — N3289 Other specified disorders of bladder: Secondary | ICD-10-CM | POA: Diagnosis not present

## 2021-03-07 DIAGNOSIS — I129 Hypertensive chronic kidney disease with stage 1 through stage 4 chronic kidney disease, or unspecified chronic kidney disease: Secondary | ICD-10-CM | POA: Diagnosis not present

## 2021-03-07 DIAGNOSIS — N189 Chronic kidney disease, unspecified: Secondary | ICD-10-CM | POA: Diagnosis not present

## 2021-03-07 DIAGNOSIS — D631 Anemia in chronic kidney disease: Secondary | ICD-10-CM | POA: Diagnosis not present

## 2021-03-07 DIAGNOSIS — R918 Other nonspecific abnormal finding of lung field: Secondary | ICD-10-CM | POA: Diagnosis not present

## 2021-03-07 DIAGNOSIS — M3130 Wegener's granulomatosis without renal involvement: Secondary | ICD-10-CM | POA: Diagnosis not present

## 2021-03-26 DIAGNOSIS — N059 Unspecified nephritic syndrome with unspecified morphologic changes: Secondary | ICD-10-CM | POA: Diagnosis not present

## 2021-03-26 DIAGNOSIS — M3130 Wegener's granulomatosis without renal involvement: Secondary | ICD-10-CM | POA: Diagnosis not present

## 2021-03-31 DIAGNOSIS — R918 Other nonspecific abnormal finding of lung field: Secondary | ICD-10-CM | POA: Diagnosis not present

## 2021-03-31 DIAGNOSIS — M3130 Wegener's granulomatosis without renal involvement: Secondary | ICD-10-CM | POA: Diagnosis not present

## 2021-03-31 DIAGNOSIS — I129 Hypertensive chronic kidney disease with stage 1 through stage 4 chronic kidney disease, or unspecified chronic kidney disease: Secondary | ICD-10-CM | POA: Diagnosis not present

## 2021-05-01 DIAGNOSIS — I10 Essential (primary) hypertension: Secondary | ICD-10-CM | POA: Diagnosis not present

## 2021-05-01 DIAGNOSIS — I776 Arteritis, unspecified: Secondary | ICD-10-CM | POA: Diagnosis not present

## 2021-05-01 DIAGNOSIS — R5383 Other fatigue: Secondary | ICD-10-CM | POA: Diagnosis not present

## 2021-05-01 DIAGNOSIS — Z0000 Encounter for general adult medical examination without abnormal findings: Secondary | ICD-10-CM | POA: Diagnosis not present

## 2021-05-08 DIAGNOSIS — N1831 Chronic kidney disease, stage 3a: Secondary | ICD-10-CM | POA: Diagnosis not present

## 2021-05-08 DIAGNOSIS — G629 Polyneuropathy, unspecified: Secondary | ICD-10-CM | POA: Diagnosis not present

## 2021-05-08 DIAGNOSIS — Z0000 Encounter for general adult medical examination without abnormal findings: Secondary | ICD-10-CM | POA: Diagnosis not present

## 2021-05-08 DIAGNOSIS — I10 Essential (primary) hypertension: Secondary | ICD-10-CM | POA: Diagnosis not present

## 2021-05-08 DIAGNOSIS — I776 Arteritis, unspecified: Secondary | ICD-10-CM | POA: Diagnosis not present

## 2021-05-09 DIAGNOSIS — N059 Unspecified nephritic syndrome with unspecified morphologic changes: Secondary | ICD-10-CM | POA: Diagnosis not present

## 2021-05-13 DIAGNOSIS — R918 Other nonspecific abnormal finding of lung field: Secondary | ICD-10-CM | POA: Diagnosis not present

## 2021-05-13 DIAGNOSIS — I129 Hypertensive chronic kidney disease with stage 1 through stage 4 chronic kidney disease, or unspecified chronic kidney disease: Secondary | ICD-10-CM | POA: Diagnosis not present

## 2021-05-13 DIAGNOSIS — N1831 Chronic kidney disease, stage 3a: Secondary | ICD-10-CM | POA: Diagnosis not present

## 2021-05-13 DIAGNOSIS — M3130 Wegener's granulomatosis without renal involvement: Secondary | ICD-10-CM | POA: Diagnosis not present

## 2021-05-30 ENCOUNTER — Inpatient Hospital Stay (HOSPITAL_COMMUNITY): Admission: RE | Admit: 2021-05-30 | Payer: Medicare HMO | Source: Ambulatory Visit

## 2021-06-04 ENCOUNTER — Other Ambulatory Visit (HOSPITAL_COMMUNITY): Payer: Self-pay | Admitting: *Deleted

## 2021-06-05 ENCOUNTER — Encounter (HOSPITAL_COMMUNITY)
Admission: RE | Admit: 2021-06-05 | Discharge: 2021-06-05 | Disposition: A | Discharge status: Home / Self Care | Payer: Medicare HMO | Source: Ambulatory Visit | Attending: Internal Medicine | Admitting: Internal Medicine

## 2021-06-05 DIAGNOSIS — N059 Unspecified nephritic syndrome with unspecified morphologic changes: Secondary | ICD-10-CM | POA: Diagnosis not present

## 2021-06-05 DIAGNOSIS — I776 Arteritis, unspecified: Secondary | ICD-10-CM | POA: Insufficient documentation

## 2021-06-05 DIAGNOSIS — M3130 Wegener's granulomatosis without renal involvement: Secondary | ICD-10-CM | POA: Diagnosis not present

## 2021-06-05 MED ORDER — DIPHENHYDRAMINE HCL 50 MG/ML IJ SOLN
25.0000 mg | Freq: Once | INTRAMUSCULAR | Status: AC
  Administered 2021-06-05: 25 mg via INTRAVENOUS

## 2021-06-05 MED ORDER — METHYLPREDNISOLONE SODIUM SUCC 125 MG IJ SOLR
125.0000 mg | Freq: Once | INTRAMUSCULAR | Status: AC
  Administered 2021-06-05: 125 mg via INTRAVENOUS

## 2021-06-05 MED ORDER — DIPHENHYDRAMINE HCL 50 MG/ML IJ SOLN
INTRAMUSCULAR | Status: AC
  Filled 2021-06-05: qty 1

## 2021-06-05 MED ORDER — ACETAMINOPHEN 325 MG PO TABS
650.0000 mg | ORAL_TABLET | Freq: Once | ORAL | Status: AC
  Administered 2021-06-05: 650 mg via ORAL

## 2021-06-05 MED ORDER — ACETAMINOPHEN 325 MG PO TABS
ORAL_TABLET | ORAL | Status: AC
  Filled 2021-06-05: qty 2

## 2021-06-05 MED ORDER — METHYLPREDNISOLONE SODIUM SUCC 125 MG IJ SOLR
INTRAMUSCULAR | Status: AC
  Filled 2021-06-05: qty 2

## 2021-06-05 MED ORDER — SODIUM CHLORIDE 0.9 % IV SOLN
1000.0000 mg | Freq: Once | INTRAVENOUS | Status: AC
  Administered 2021-06-05: 1000 mg via INTRAVENOUS
  Filled 2021-06-05: qty 100

## 2021-06-11 DIAGNOSIS — I129 Hypertensive chronic kidney disease with stage 1 through stage 4 chronic kidney disease, or unspecified chronic kidney disease: Secondary | ICD-10-CM | POA: Diagnosis not present

## 2021-06-11 DIAGNOSIS — R918 Other nonspecific abnormal finding of lung field: Secondary | ICD-10-CM | POA: Diagnosis not present

## 2021-06-11 DIAGNOSIS — M3130 Wegener's granulomatosis without renal involvement: Secondary | ICD-10-CM | POA: Diagnosis not present

## 2021-06-11 DIAGNOSIS — N1831 Chronic kidney disease, stage 3a: Secondary | ICD-10-CM | POA: Diagnosis not present

## 2021-06-19 DIAGNOSIS — D1801 Hemangioma of skin and subcutaneous tissue: Secondary | ICD-10-CM | POA: Diagnosis not present

## 2021-06-19 DIAGNOSIS — Z85828 Personal history of other malignant neoplasm of skin: Secondary | ICD-10-CM | POA: Diagnosis not present

## 2021-06-19 DIAGNOSIS — L821 Other seborrheic keratosis: Secondary | ICD-10-CM | POA: Diagnosis not present

## 2021-06-19 DIAGNOSIS — L814 Other melanin hyperpigmentation: Secondary | ICD-10-CM | POA: Diagnosis not present

## 2021-06-19 DIAGNOSIS — Z08 Encounter for follow-up examination after completed treatment for malignant neoplasm: Secondary | ICD-10-CM | POA: Diagnosis not present

## 2021-06-19 DIAGNOSIS — L570 Actinic keratosis: Secondary | ICD-10-CM | POA: Diagnosis not present

## 2021-07-14 DIAGNOSIS — G629 Polyneuropathy, unspecified: Secondary | ICD-10-CM | POA: Diagnosis not present

## 2021-07-14 DIAGNOSIS — N1831 Chronic kidney disease, stage 3a: Secondary | ICD-10-CM | POA: Diagnosis not present

## 2021-07-14 DIAGNOSIS — N059 Unspecified nephritic syndrome with unspecified morphologic changes: Secondary | ICD-10-CM | POA: Diagnosis not present

## 2021-07-14 DIAGNOSIS — R918 Other nonspecific abnormal finding of lung field: Secondary | ICD-10-CM | POA: Diagnosis not present

## 2021-07-14 DIAGNOSIS — M3130 Wegener's granulomatosis without renal involvement: Secondary | ICD-10-CM | POA: Diagnosis not present

## 2021-07-14 DIAGNOSIS — I129 Hypertensive chronic kidney disease with stage 1 through stage 4 chronic kidney disease, or unspecified chronic kidney disease: Secondary | ICD-10-CM | POA: Diagnosis not present

## 2021-07-31 DIAGNOSIS — N189 Chronic kidney disease, unspecified: Secondary | ICD-10-CM | POA: Diagnosis not present

## 2021-07-31 DIAGNOSIS — N1831 Chronic kidney disease, stage 3a: Secondary | ICD-10-CM | POA: Diagnosis not present

## 2021-08-25 DIAGNOSIS — R809 Proteinuria, unspecified: Secondary | ICD-10-CM | POA: Diagnosis not present

## 2021-09-01 DIAGNOSIS — R809 Proteinuria, unspecified: Secondary | ICD-10-CM | POA: Diagnosis not present

## 2021-09-01 DIAGNOSIS — N1831 Chronic kidney disease, stage 3a: Secondary | ICD-10-CM | POA: Diagnosis not present

## 2021-09-09 DIAGNOSIS — M3130 Wegener's granulomatosis without renal involvement: Secondary | ICD-10-CM | POA: Diagnosis not present

## 2021-09-09 DIAGNOSIS — R809 Proteinuria, unspecified: Secondary | ICD-10-CM | POA: Diagnosis not present

## 2021-09-09 DIAGNOSIS — I129 Hypertensive chronic kidney disease with stage 1 through stage 4 chronic kidney disease, or unspecified chronic kidney disease: Secondary | ICD-10-CM | POA: Diagnosis not present

## 2021-09-09 DIAGNOSIS — N1831 Chronic kidney disease, stage 3a: Secondary | ICD-10-CM | POA: Diagnosis not present

## 2021-09-09 DIAGNOSIS — R918 Other nonspecific abnormal finding of lung field: Secondary | ICD-10-CM | POA: Diagnosis not present

## 2021-10-30 DIAGNOSIS — I776 Arteritis, unspecified: Secondary | ICD-10-CM | POA: Diagnosis not present

## 2021-10-30 DIAGNOSIS — G629 Polyneuropathy, unspecified: Secondary | ICD-10-CM | POA: Diagnosis not present

## 2021-10-30 DIAGNOSIS — N1831 Chronic kidney disease, stage 3a: Secondary | ICD-10-CM | POA: Diagnosis not present

## 2021-10-30 DIAGNOSIS — I10 Essential (primary) hypertension: Secondary | ICD-10-CM | POA: Diagnosis not present

## 2021-11-05 DIAGNOSIS — N1831 Chronic kidney disease, stage 3a: Secondary | ICD-10-CM | POA: Diagnosis not present

## 2021-11-05 DIAGNOSIS — R809 Proteinuria, unspecified: Secondary | ICD-10-CM | POA: Diagnosis not present

## 2021-11-06 DIAGNOSIS — I776 Arteritis, unspecified: Secondary | ICD-10-CM | POA: Diagnosis not present

## 2021-11-06 DIAGNOSIS — G629 Polyneuropathy, unspecified: Secondary | ICD-10-CM | POA: Diagnosis not present

## 2021-11-06 DIAGNOSIS — E782 Mixed hyperlipidemia: Secondary | ICD-10-CM | POA: Diagnosis not present

## 2021-11-06 DIAGNOSIS — I10 Essential (primary) hypertension: Secondary | ICD-10-CM | POA: Diagnosis not present

## 2021-11-06 DIAGNOSIS — N529 Male erectile dysfunction, unspecified: Secondary | ICD-10-CM | POA: Diagnosis not present

## 2021-11-06 DIAGNOSIS — N1831 Chronic kidney disease, stage 3a: Secondary | ICD-10-CM | POA: Diagnosis not present

## 2021-11-13 DIAGNOSIS — I129 Hypertensive chronic kidney disease with stage 1 through stage 4 chronic kidney disease, or unspecified chronic kidney disease: Secondary | ICD-10-CM | POA: Diagnosis not present

## 2021-11-13 DIAGNOSIS — N1831 Chronic kidney disease, stage 3a: Secondary | ICD-10-CM | POA: Diagnosis not present

## 2021-11-13 DIAGNOSIS — M3130 Wegener's granulomatosis without renal involvement: Secondary | ICD-10-CM | POA: Diagnosis not present

## 2021-11-13 DIAGNOSIS — I7782 Antineutrophilic cytoplasmic antibody (ANCA) vasculitis: Secondary | ICD-10-CM | POA: Diagnosis not present

## 2021-11-13 DIAGNOSIS — R918 Other nonspecific abnormal finding of lung field: Secondary | ICD-10-CM | POA: Diagnosis not present

## 2021-11-24 ENCOUNTER — Other Ambulatory Visit (HOSPITAL_COMMUNITY): Payer: Self-pay | Admitting: *Deleted

## 2021-11-25 ENCOUNTER — Encounter (HOSPITAL_COMMUNITY)
Admission: RE | Admit: 2021-11-25 | Discharge: 2021-11-25 | Disposition: A | Discharge status: Home / Self Care | Payer: Medicare HMO | Source: Ambulatory Visit | Attending: Internal Medicine | Admitting: Internal Medicine

## 2021-11-25 DIAGNOSIS — I776 Arteritis, unspecified: Secondary | ICD-10-CM | POA: Diagnosis present

## 2021-11-25 DIAGNOSIS — N059 Unspecified nephritic syndrome with unspecified morphologic changes: Secondary | ICD-10-CM | POA: Insufficient documentation

## 2021-11-25 DIAGNOSIS — M3130 Wegener's granulomatosis without renal involvement: Secondary | ICD-10-CM | POA: Diagnosis not present

## 2021-11-25 MED ORDER — DIPHENHYDRAMINE HCL 50 MG/ML IJ SOLN
INTRAMUSCULAR | Status: AC
  Filled 2021-11-25: qty 1

## 2021-11-25 MED ORDER — SODIUM CHLORIDE 0.9 % IV SOLN
1000.0000 mg | Freq: Once | INTRAVENOUS | Status: AC
  Administered 2021-11-25: 1000 mg via INTRAVENOUS
  Filled 2021-11-25: qty 100

## 2021-11-25 MED ORDER — DIPHENHYDRAMINE HCL 50 MG/ML IJ SOLN
25.0000 mg | Freq: Once | INTRAMUSCULAR | Status: AC
  Administered 2021-11-25: 25 mg via INTRAVENOUS

## 2021-11-25 MED ORDER — ACETAMINOPHEN 325 MG PO TABS
650.0000 mg | ORAL_TABLET | Freq: Once | ORAL | Status: AC
  Administered 2021-11-25: 650 mg via ORAL

## 2021-11-25 MED ORDER — METHYLPREDNISOLONE SODIUM SUCC 125 MG IJ SOLR
INTRAMUSCULAR | Status: AC
  Filled 2021-11-25: qty 2

## 2021-11-25 MED ORDER — METHYLPREDNISOLONE SODIUM SUCC 125 MG IJ SOLR
125.0000 mg | Freq: Once | INTRAMUSCULAR | Status: AC
  Administered 2021-11-25: 125 mg via INTRAVENOUS

## 2021-11-25 MED ORDER — ACETAMINOPHEN 325 MG PO TABS
ORAL_TABLET | ORAL | Status: AC
  Filled 2021-11-25: qty 2

## 2021-12-10 DIAGNOSIS — I700 Atherosclerosis of aorta: Secondary | ICD-10-CM | POA: Diagnosis not present

## 2021-12-10 DIAGNOSIS — R918 Other nonspecific abnormal finding of lung field: Secondary | ICD-10-CM | POA: Diagnosis not present

## 2021-12-10 DIAGNOSIS — J984 Other disorders of lung: Secondary | ICD-10-CM | POA: Diagnosis not present

## 2021-12-16 DIAGNOSIS — H52222 Regular astigmatism, left eye: Secondary | ICD-10-CM | POA: Diagnosis not present

## 2021-12-16 DIAGNOSIS — H524 Presbyopia: Secondary | ICD-10-CM | POA: Diagnosis not present

## 2021-12-16 DIAGNOSIS — H5203 Hypermetropia, bilateral: Secondary | ICD-10-CM | POA: Diagnosis not present

## 2021-12-17 DIAGNOSIS — N1831 Chronic kidney disease, stage 3a: Secondary | ICD-10-CM | POA: Diagnosis not present

## 2021-12-17 DIAGNOSIS — I776 Arteritis, unspecified: Secondary | ICD-10-CM | POA: Diagnosis not present

## 2021-12-17 DIAGNOSIS — I10 Essential (primary) hypertension: Secondary | ICD-10-CM | POA: Diagnosis not present

## 2021-12-17 DIAGNOSIS — E782 Mixed hyperlipidemia: Secondary | ICD-10-CM | POA: Diagnosis not present

## 2021-12-18 DIAGNOSIS — R918 Other nonspecific abnormal finding of lung field: Secondary | ICD-10-CM | POA: Diagnosis not present

## 2021-12-23 DIAGNOSIS — L821 Other seborrheic keratosis: Secondary | ICD-10-CM | POA: Diagnosis not present

## 2021-12-23 DIAGNOSIS — L814 Other melanin hyperpigmentation: Secondary | ICD-10-CM | POA: Diagnosis not present

## 2021-12-23 DIAGNOSIS — L570 Actinic keratosis: Secondary | ICD-10-CM | POA: Diagnosis not present

## 2021-12-23 DIAGNOSIS — D1801 Hemangioma of skin and subcutaneous tissue: Secondary | ICD-10-CM | POA: Diagnosis not present

## 2021-12-23 DIAGNOSIS — L578 Other skin changes due to chronic exposure to nonionizing radiation: Secondary | ICD-10-CM | POA: Diagnosis not present

## 2021-12-23 DIAGNOSIS — C4442 Squamous cell carcinoma of skin of scalp and neck: Secondary | ICD-10-CM | POA: Diagnosis not present

## 2021-12-23 DIAGNOSIS — D485 Neoplasm of uncertain behavior of skin: Secondary | ICD-10-CM | POA: Diagnosis not present

## 2022-01-08 DIAGNOSIS — N1831 Chronic kidney disease, stage 3a: Secondary | ICD-10-CM | POA: Diagnosis not present

## 2022-01-13 DIAGNOSIS — I129 Hypertensive chronic kidney disease with stage 1 through stage 4 chronic kidney disease, or unspecified chronic kidney disease: Secondary | ICD-10-CM | POA: Diagnosis not present

## 2022-01-13 DIAGNOSIS — R918 Other nonspecific abnormal finding of lung field: Secondary | ICD-10-CM | POA: Diagnosis not present

## 2022-01-13 DIAGNOSIS — M3130 Wegener's granulomatosis without renal involvement: Secondary | ICD-10-CM | POA: Diagnosis not present

## 2022-01-13 DIAGNOSIS — N400 Enlarged prostate without lower urinary tract symptoms: Secondary | ICD-10-CM | POA: Diagnosis not present

## 2022-01-13 DIAGNOSIS — N1831 Chronic kidney disease, stage 3a: Secondary | ICD-10-CM | POA: Diagnosis not present

## 2022-02-06 DIAGNOSIS — C4442 Squamous cell carcinoma of skin of scalp and neck: Secondary | ICD-10-CM | POA: Diagnosis not present

## 2022-02-17 DIAGNOSIS — N1831 Chronic kidney disease, stage 3a: Secondary | ICD-10-CM | POA: Diagnosis not present

## 2022-02-17 DIAGNOSIS — N179 Acute kidney failure, unspecified: Secondary | ICD-10-CM | POA: Diagnosis not present

## 2022-03-18 DIAGNOSIS — N390 Urinary tract infection, site not specified: Secondary | ICD-10-CM | POA: Diagnosis not present

## 2022-03-18 DIAGNOSIS — I129 Hypertensive chronic kidney disease with stage 1 through stage 4 chronic kidney disease, or unspecified chronic kidney disease: Secondary | ICD-10-CM | POA: Diagnosis not present

## 2022-03-18 DIAGNOSIS — R918 Other nonspecific abnormal finding of lung field: Secondary | ICD-10-CM | POA: Diagnosis not present

## 2022-03-18 DIAGNOSIS — M3130 Wegener's granulomatosis without renal involvement: Secondary | ICD-10-CM | POA: Diagnosis not present

## 2022-03-18 DIAGNOSIS — N1831 Chronic kidney disease, stage 3a: Secondary | ICD-10-CM | POA: Diagnosis not present

## 2022-05-20 DIAGNOSIS — E782 Mixed hyperlipidemia: Secondary | ICD-10-CM | POA: Diagnosis not present

## 2022-05-20 DIAGNOSIS — I776 Arteritis, unspecified: Secondary | ICD-10-CM | POA: Diagnosis not present

## 2022-05-20 DIAGNOSIS — N1831 Chronic kidney disease, stage 3a: Secondary | ICD-10-CM | POA: Diagnosis not present

## 2022-05-20 DIAGNOSIS — I10 Essential (primary) hypertension: Secondary | ICD-10-CM | POA: Diagnosis not present

## 2022-05-20 DIAGNOSIS — G629 Polyneuropathy, unspecified: Secondary | ICD-10-CM | POA: Diagnosis not present

## 2022-05-20 DIAGNOSIS — R5383 Other fatigue: Secondary | ICD-10-CM | POA: Diagnosis not present

## 2022-05-26 DIAGNOSIS — N529 Male erectile dysfunction, unspecified: Secondary | ICD-10-CM | POA: Diagnosis not present

## 2022-05-26 DIAGNOSIS — Z0000 Encounter for general adult medical examination without abnormal findings: Secondary | ICD-10-CM | POA: Diagnosis not present

## 2022-05-26 DIAGNOSIS — G629 Polyneuropathy, unspecified: Secondary | ICD-10-CM | POA: Diagnosis not present

## 2022-05-26 DIAGNOSIS — E782 Mixed hyperlipidemia: Secondary | ICD-10-CM | POA: Diagnosis not present

## 2022-05-26 DIAGNOSIS — I8393 Asymptomatic varicose veins of bilateral lower extremities: Secondary | ICD-10-CM | POA: Diagnosis not present

## 2022-05-26 DIAGNOSIS — I776 Arteritis, unspecified: Secondary | ICD-10-CM | POA: Diagnosis not present

## 2022-05-26 DIAGNOSIS — N1831 Chronic kidney disease, stage 3a: Secondary | ICD-10-CM | POA: Diagnosis not present

## 2022-05-26 DIAGNOSIS — I10 Essential (primary) hypertension: Secondary | ICD-10-CM | POA: Diagnosis not present

## 2022-06-11 DIAGNOSIS — N390 Urinary tract infection, site not specified: Secondary | ICD-10-CM | POA: Diagnosis not present

## 2022-06-11 DIAGNOSIS — N1831 Chronic kidney disease, stage 3a: Secondary | ICD-10-CM | POA: Diagnosis not present

## 2022-06-16 DIAGNOSIS — N1831 Chronic kidney disease, stage 3a: Secondary | ICD-10-CM | POA: Diagnosis not present

## 2022-06-16 DIAGNOSIS — N400 Enlarged prostate without lower urinary tract symptoms: Secondary | ICD-10-CM | POA: Diagnosis not present

## 2022-06-16 DIAGNOSIS — M3130 Wegener's granulomatosis without renal involvement: Secondary | ICD-10-CM | POA: Diagnosis not present

## 2022-06-16 DIAGNOSIS — R918 Other nonspecific abnormal finding of lung field: Secondary | ICD-10-CM | POA: Diagnosis not present

## 2022-06-16 DIAGNOSIS — I129 Hypertensive chronic kidney disease with stage 1 through stage 4 chronic kidney disease, or unspecified chronic kidney disease: Secondary | ICD-10-CM | POA: Diagnosis not present

## 2022-06-23 DIAGNOSIS — Z08 Encounter for follow-up examination after completed treatment for malignant neoplasm: Secondary | ICD-10-CM | POA: Diagnosis not present

## 2022-06-23 DIAGNOSIS — Z789 Other specified health status: Secondary | ICD-10-CM | POA: Diagnosis not present

## 2022-06-23 DIAGNOSIS — L821 Other seborrheic keratosis: Secondary | ICD-10-CM | POA: Diagnosis not present

## 2022-06-23 DIAGNOSIS — Z85828 Personal history of other malignant neoplasm of skin: Secondary | ICD-10-CM | POA: Diagnosis not present

## 2022-06-23 DIAGNOSIS — L918 Other hypertrophic disorders of the skin: Secondary | ICD-10-CM | POA: Diagnosis not present

## 2022-06-23 DIAGNOSIS — L820 Inflamed seborrheic keratosis: Secondary | ICD-10-CM | POA: Diagnosis not present

## 2022-06-23 DIAGNOSIS — D1801 Hemangioma of skin and subcutaneous tissue: Secondary | ICD-10-CM | POA: Diagnosis not present

## 2022-06-23 DIAGNOSIS — L814 Other melanin hyperpigmentation: Secondary | ICD-10-CM | POA: Diagnosis not present

## 2022-06-23 DIAGNOSIS — L538 Other specified erythematous conditions: Secondary | ICD-10-CM | POA: Diagnosis not present

## 2022-06-30 DIAGNOSIS — H52209 Unspecified astigmatism, unspecified eye: Secondary | ICD-10-CM | POA: Diagnosis not present

## 2022-06-30 DIAGNOSIS — H5203 Hypermetropia, bilateral: Secondary | ICD-10-CM | POA: Diagnosis not present

## 2022-06-30 DIAGNOSIS — H524 Presbyopia: Secondary | ICD-10-CM | POA: Diagnosis not present

## 2022-07-03 ENCOUNTER — Inpatient Hospital Stay (HOSPITAL_COMMUNITY): Admission: RE | Admit: 2022-07-03 | Payer: Medicare HMO | Source: Ambulatory Visit

## 2022-07-06 ENCOUNTER — Other Ambulatory Visit (HOSPITAL_COMMUNITY): Payer: Self-pay | Admitting: *Deleted

## 2022-07-08 ENCOUNTER — Encounter (HOSPITAL_COMMUNITY)
Admission: RE | Admit: 2022-07-08 | Discharge: 2022-07-08 | Disposition: A | Discharge status: Home / Self Care | Payer: Medicare HMO | Source: Ambulatory Visit | Attending: Internal Medicine | Admitting: Internal Medicine

## 2022-07-08 DIAGNOSIS — N059 Unspecified nephritic syndrome with unspecified morphologic changes: Secondary | ICD-10-CM | POA: Insufficient documentation

## 2022-07-08 DIAGNOSIS — M3130 Wegener's granulomatosis without renal involvement: Secondary | ICD-10-CM | POA: Insufficient documentation

## 2022-07-08 DIAGNOSIS — I776 Arteritis, unspecified: Secondary | ICD-10-CM | POA: Diagnosis present

## 2022-07-08 MED ORDER — METHYLPREDNISOLONE SODIUM SUCC 125 MG IJ SOLR: 125.0000 mg | Freq: Once | INTRAMUSCULAR | Status: AC

## 2022-07-08 MED ORDER — DIPHENHYDRAMINE HCL 50 MG/ML IJ SOLN: 25.0000 mg | Freq: Once | INTRAMUSCULAR | Status: AC

## 2022-07-08 MED ORDER — ACETAMINOPHEN 325 MG PO TABS: 650.0000 mg | ORAL_TABLET | Freq: Once | ORAL | Status: AC

## 2022-07-08 MED ORDER — METHYLPREDNISOLONE SODIUM SUCC 125 MG IJ SOLR
INTRAMUSCULAR | Status: AC
  Administered 2022-07-08: 125 mg via INTRAVENOUS
  Filled 2022-07-08: qty 2

## 2022-07-08 MED ORDER — DIPHENHYDRAMINE HCL 50 MG/ML IJ SOLN
INTRAMUSCULAR | Status: AC
  Administered 2022-07-08: 25 mg via INTRAVENOUS
  Filled 2022-07-08: qty 1

## 2022-07-08 MED ORDER — ACETAMINOPHEN 325 MG PO TABS
ORAL_TABLET | ORAL | Status: AC
  Administered 2022-07-08: 650 mg via ORAL
  Filled 2022-07-08: qty 2

## 2022-07-08 MED ORDER — SODIUM CHLORIDE 0.9 % IV SOLN
500.0000 mg | Freq: Once | INTRAVENOUS | Status: AC
  Administered 2022-07-08: 500 mg via INTRAVENOUS
  Filled 2022-07-08: qty 50

## 2022-08-11 DIAGNOSIS — I8393 Asymptomatic varicose veins of bilateral lower extremities: Secondary | ICD-10-CM | POA: Diagnosis not present

## 2022-08-11 DIAGNOSIS — N529 Male erectile dysfunction, unspecified: Secondary | ICD-10-CM | POA: Diagnosis not present

## 2022-08-11 DIAGNOSIS — E782 Mixed hyperlipidemia: Secondary | ICD-10-CM | POA: Diagnosis not present

## 2022-08-11 DIAGNOSIS — N1831 Chronic kidney disease, stage 3a: Secondary | ICD-10-CM | POA: Diagnosis not present

## 2022-08-11 DIAGNOSIS — I776 Arteritis, unspecified: Secondary | ICD-10-CM | POA: Diagnosis not present

## 2022-08-11 DIAGNOSIS — G629 Polyneuropathy, unspecified: Secondary | ICD-10-CM | POA: Diagnosis not present

## 2022-08-11 DIAGNOSIS — I10 Essential (primary) hypertension: Secondary | ICD-10-CM | POA: Diagnosis not present

## 2022-09-03 DIAGNOSIS — N390 Urinary tract infection, site not specified: Secondary | ICD-10-CM | POA: Diagnosis not present

## 2022-09-03 DIAGNOSIS — N1831 Chronic kidney disease, stage 3a: Secondary | ICD-10-CM | POA: Diagnosis not present

## 2022-09-08 DIAGNOSIS — I129 Hypertensive chronic kidney disease with stage 1 through stage 4 chronic kidney disease, or unspecified chronic kidney disease: Secondary | ICD-10-CM | POA: Diagnosis not present

## 2022-09-08 DIAGNOSIS — N1831 Chronic kidney disease, stage 3a: Secondary | ICD-10-CM | POA: Diagnosis not present

## 2022-09-08 DIAGNOSIS — R918 Other nonspecific abnormal finding of lung field: Secondary | ICD-10-CM | POA: Diagnosis not present

## 2022-09-08 DIAGNOSIS — M3130 Wegener's granulomatosis without renal involvement: Secondary | ICD-10-CM | POA: Diagnosis not present

## 2022-12-01 DIAGNOSIS — N1831 Chronic kidney disease, stage 3a: Secondary | ICD-10-CM | POA: Diagnosis not present

## 2022-12-08 DIAGNOSIS — R918 Other nonspecific abnormal finding of lung field: Secondary | ICD-10-CM | POA: Diagnosis not present

## 2022-12-08 DIAGNOSIS — I129 Hypertensive chronic kidney disease with stage 1 through stage 4 chronic kidney disease, or unspecified chronic kidney disease: Secondary | ICD-10-CM | POA: Diagnosis not present

## 2022-12-08 DIAGNOSIS — N1831 Chronic kidney disease, stage 3a: Secondary | ICD-10-CM | POA: Diagnosis not present

## 2022-12-08 DIAGNOSIS — M3130 Wegener's granulomatosis without renal involvement: Secondary | ICD-10-CM | POA: Diagnosis not present

## 2022-12-08 DIAGNOSIS — R8281 Pyuria: Secondary | ICD-10-CM | POA: Diagnosis not present

## 2022-12-17 DIAGNOSIS — F430 Acute stress reaction: Secondary | ICD-10-CM | POA: Diagnosis not present

## 2022-12-25 DIAGNOSIS — F430 Acute stress reaction: Secondary | ICD-10-CM | POA: Diagnosis not present

## 2022-12-29 DIAGNOSIS — L814 Other melanin hyperpigmentation: Secondary | ICD-10-CM | POA: Diagnosis not present

## 2022-12-29 DIAGNOSIS — Z08 Encounter for follow-up examination after completed treatment for malignant neoplasm: Secondary | ICD-10-CM | POA: Diagnosis not present

## 2022-12-29 DIAGNOSIS — D225 Melanocytic nevi of trunk: Secondary | ICD-10-CM | POA: Diagnosis not present

## 2022-12-29 DIAGNOSIS — Z85828 Personal history of other malignant neoplasm of skin: Secondary | ICD-10-CM | POA: Diagnosis not present

## 2022-12-29 DIAGNOSIS — L821 Other seborrheic keratosis: Secondary | ICD-10-CM | POA: Diagnosis not present

## 2023-01-08 ENCOUNTER — Inpatient Hospital Stay (HOSPITAL_COMMUNITY): Admission: RE | Admit: 2023-01-08 | Payer: Medicare HMO | Source: Ambulatory Visit

## 2023-01-14 DIAGNOSIS — F430 Acute stress reaction: Secondary | ICD-10-CM | POA: Diagnosis not present

## 2023-01-19 DIAGNOSIS — F430 Acute stress reaction: Secondary | ICD-10-CM | POA: Diagnosis not present

## 2023-01-25 DIAGNOSIS — F430 Acute stress reaction: Secondary | ICD-10-CM | POA: Diagnosis not present

## 2023-01-28 IMAGING — US US RENAL
1 series · 14 of 25 positions shown · non-contrast
Comparison: None.

CLINICAL DATA: Renal failure.

EXAM:
RENAL / URINARY TRACT ULTRASOUND COMPLETE

[Series 1: us renal · 14 of 81 slices shown]
[im 1/81]
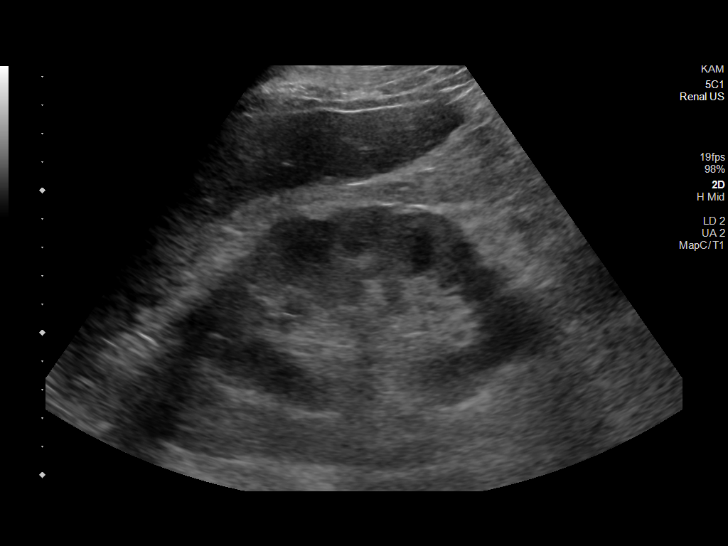
[im 7/81]
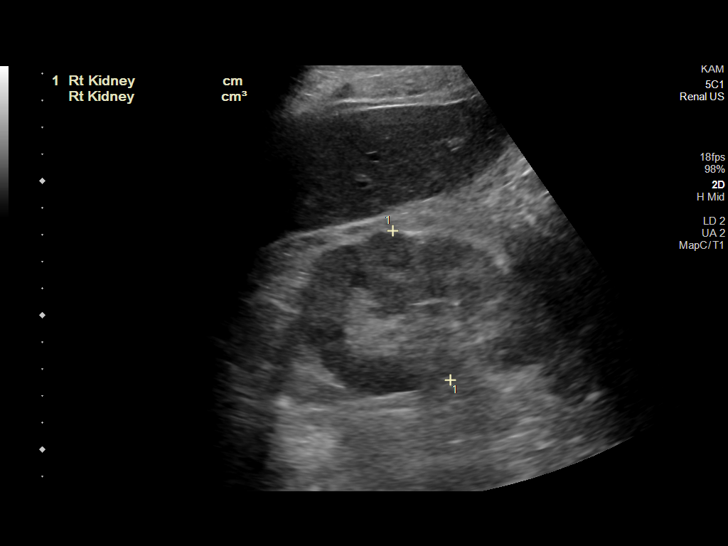
[im 14/81]
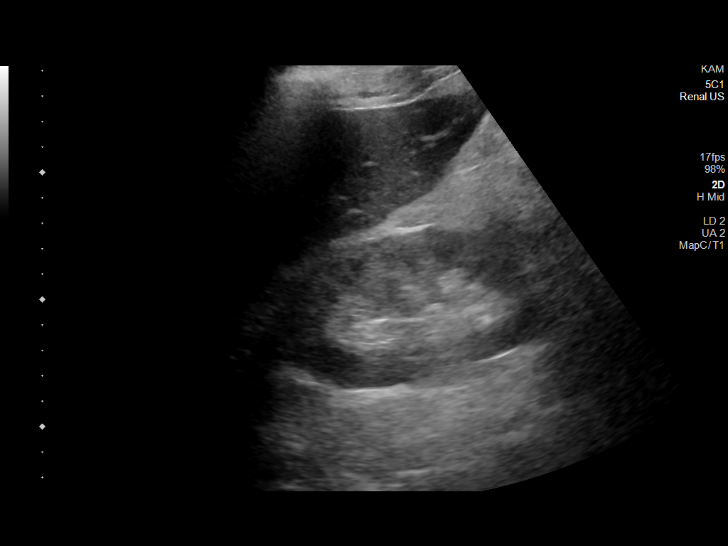
[im 21/81]
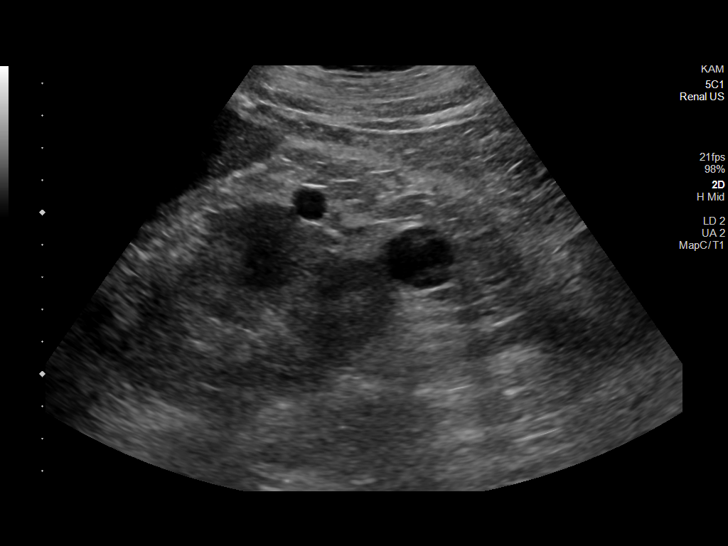
[im 27/81]
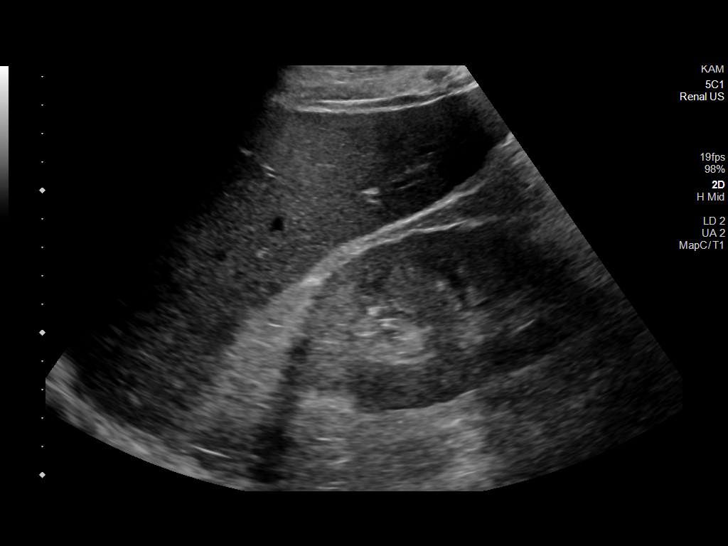
[im 31/81]
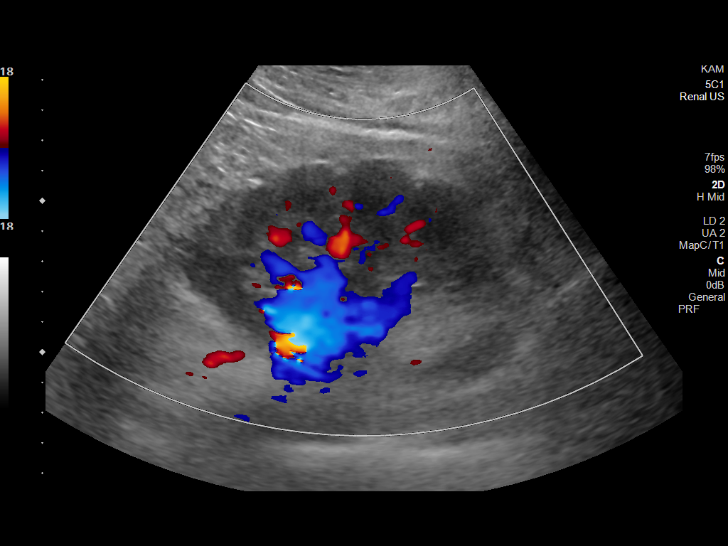
[im 37/81]
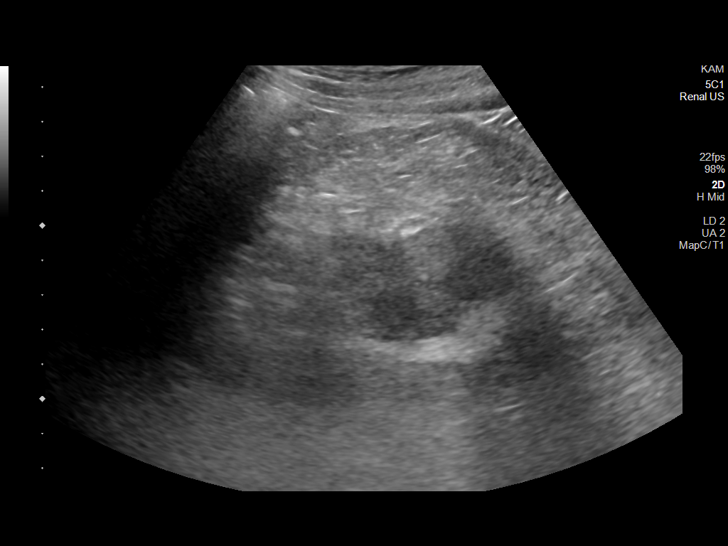
[im 44/81]
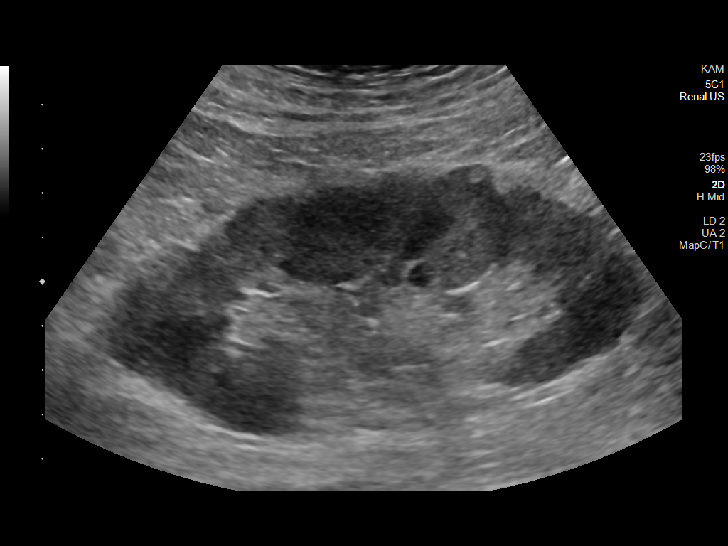
[im 51/81]
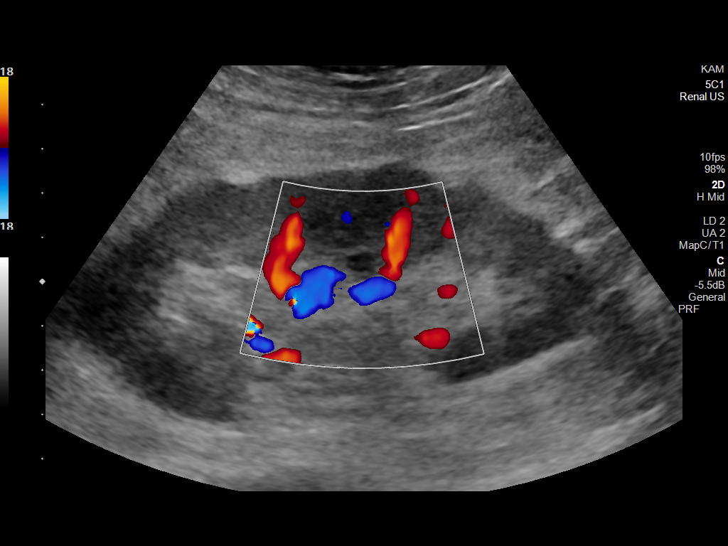
[im 54/81]
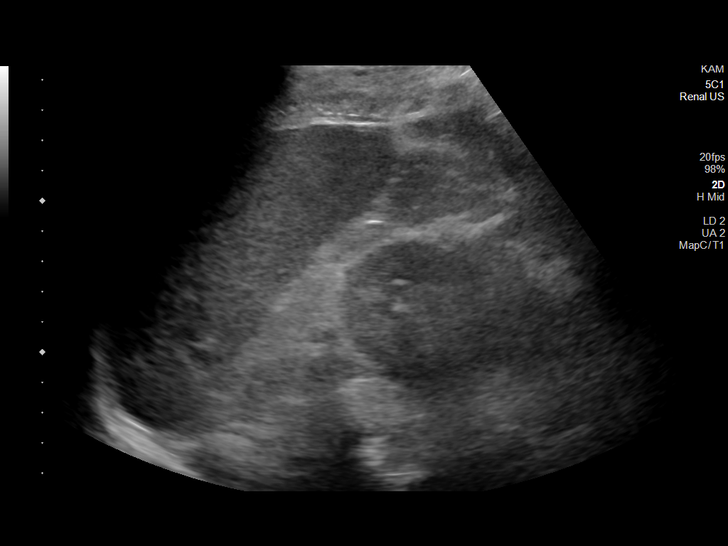
[im 61/81]
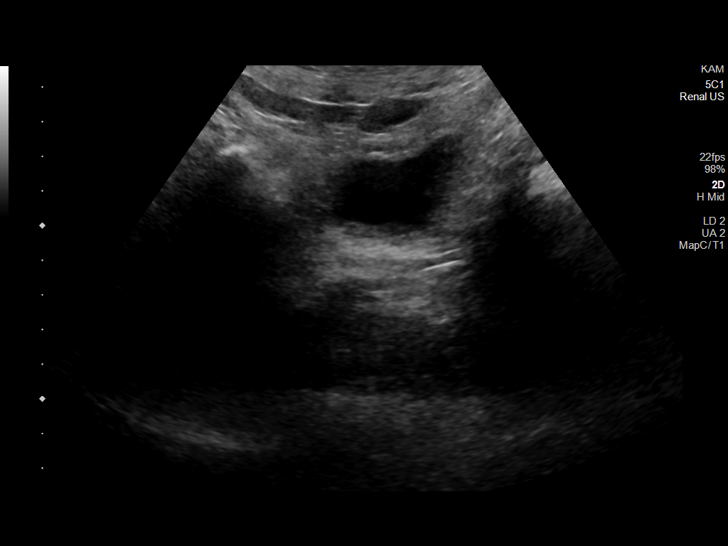
[im 67/81]
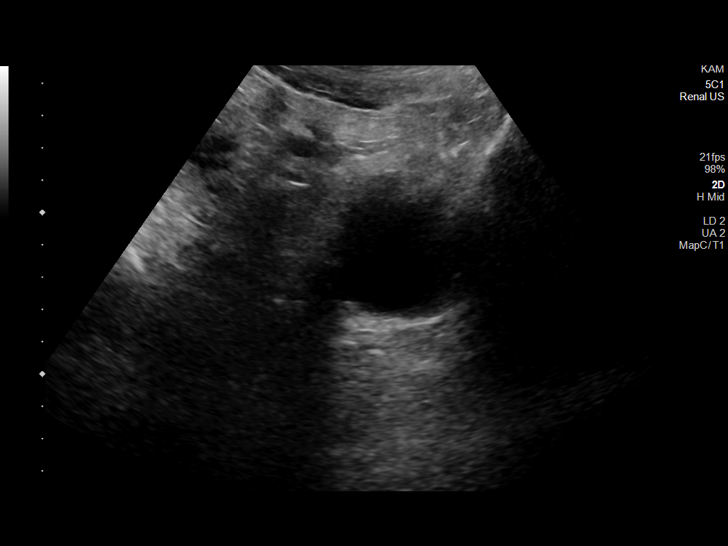
[im 74/81]
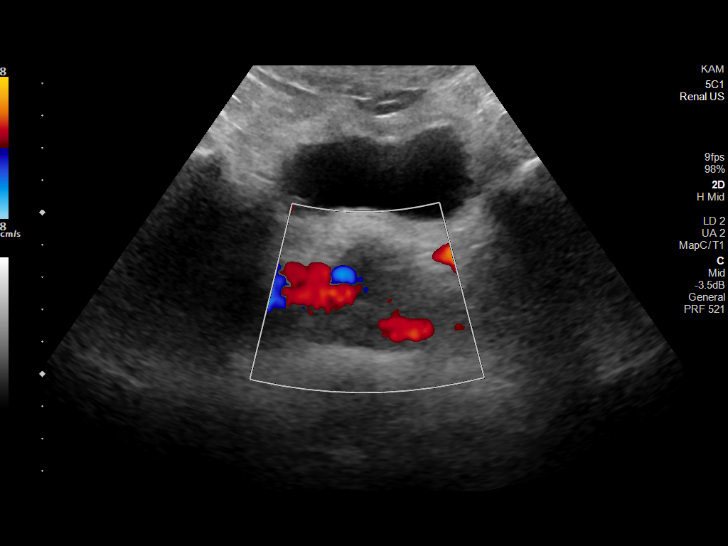
[im 81/81]
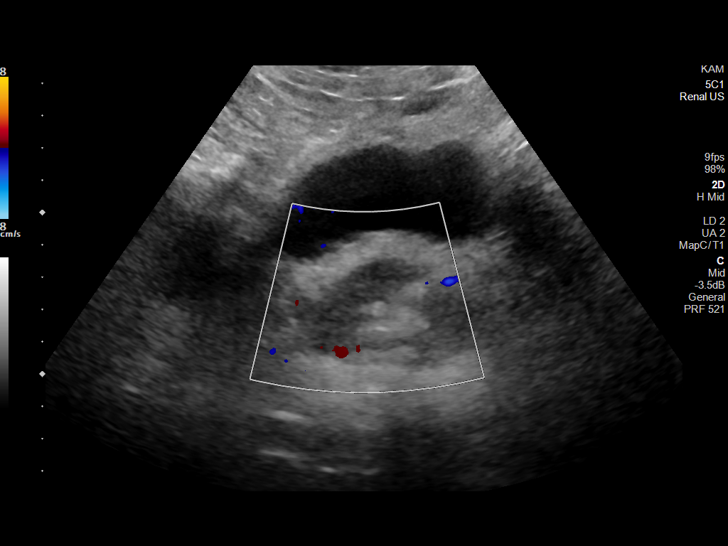

[14 of 25 positions shown; findings below may reference images not displayed]

FINDINGS: Right Kidney:

Renal measurements: 12.8 x 6.6 x 6.0 cm = volume: 262.7 mL.
Echogenicity appears increased. Two simple cysts are seen measuring
1.0 and 2.3 cm. No solid mass or hydronephrosis visualized.

Left Kidney:

Renal measurements: 12.1 x 6.8 x 6.4 cm = volume: 276 mL.
Echogenicity appears increased. 0.7 cm cyst noted. No solid mass or
hydronephrosis visualized.

Bladder:

Appears normal for degree of bladder distention.

Other:

Prostate gland appears enlarged.
IMPRESSION: Negative for hydronephrosis or acute abnormality.

Increased cortical echogenicity compatible with medical renal
disease.

## 2023-01-29 DIAGNOSIS — F430 Acute stress reaction: Secondary | ICD-10-CM | POA: Diagnosis not present

## 2023-02-01 DIAGNOSIS — F430 Acute stress reaction: Secondary | ICD-10-CM | POA: Diagnosis not present

## 2023-02-02 ENCOUNTER — Other Ambulatory Visit (HOSPITAL_COMMUNITY): Payer: Self-pay | Admitting: *Deleted

## 2023-02-03 ENCOUNTER — Encounter (HOSPITAL_COMMUNITY)
Admission: RE | Admit: 2023-02-03 | Discharge: 2023-02-03 | Disposition: A | Discharge status: Home / Self Care | Payer: Medicare HMO | Source: Ambulatory Visit | Attending: Internal Medicine | Admitting: Internal Medicine

## 2023-02-03 DIAGNOSIS — M3130 Wegener's granulomatosis without renal involvement: Secondary | ICD-10-CM | POA: Insufficient documentation

## 2023-02-03 DIAGNOSIS — I776 Arteritis, unspecified: Secondary | ICD-10-CM | POA: Insufficient documentation

## 2023-02-03 DIAGNOSIS — N059 Unspecified nephritic syndrome with unspecified morphologic changes: Secondary | ICD-10-CM | POA: Insufficient documentation

## 2023-02-03 MED ORDER — DIPHENHYDRAMINE HCL 50 MG/ML IJ SOLN
INTRAMUSCULAR | Status: AC
  Filled 2023-02-03: qty 1

## 2023-02-03 MED ORDER — METHYLPREDNISOLONE SODIUM SUCC 125 MG IJ SOLR
125.0000 mg | Freq: Once | INTRAMUSCULAR | Status: AC
  Administered 2023-02-03: 125 mg via INTRAVENOUS

## 2023-02-03 MED ORDER — DIPHENHYDRAMINE HCL 50 MG/ML IJ SOLN
25.0000 mg | Freq: Once | INTRAMUSCULAR | Status: AC
  Administered 2023-02-03: 25 mg via INTRAVENOUS

## 2023-02-03 MED ORDER — ACETAMINOPHEN 325 MG PO TABS
650.0000 mg | ORAL_TABLET | Freq: Once | ORAL | Status: AC
  Administered 2023-02-03: 650 mg via ORAL

## 2023-02-03 MED ORDER — ACETAMINOPHEN 325 MG PO TABS
ORAL_TABLET | ORAL | Status: AC
  Filled 2023-02-03: qty 2

## 2023-02-03 MED ORDER — METHYLPREDNISOLONE SODIUM SUCC 125 MG IJ SOLR
INTRAMUSCULAR | Status: AC
  Filled 2023-02-03: qty 2

## 2023-02-03 MED ORDER — SODIUM CHLORIDE 0.9 % IV SOLN
500.0000 mg | Freq: Once | INTRAVENOUS | Status: AC
  Administered 2023-02-03: 500 mg via INTRAVENOUS
  Filled 2023-02-03: qty 50

## 2023-02-08 DIAGNOSIS — F430 Acute stress reaction: Secondary | ICD-10-CM | POA: Diagnosis not present

## 2023-02-12 DIAGNOSIS — F430 Acute stress reaction: Secondary | ICD-10-CM | POA: Diagnosis not present

## 2023-02-14 IMAGING — US US BIOPSY
1 series · 13 of 22 positions shown · non-contrast
Comparison: none

INDICATION: Acute renal failure

[Series 1: us biopsy (kidney) · 13 of 22 slices shown]
[im 1/22]
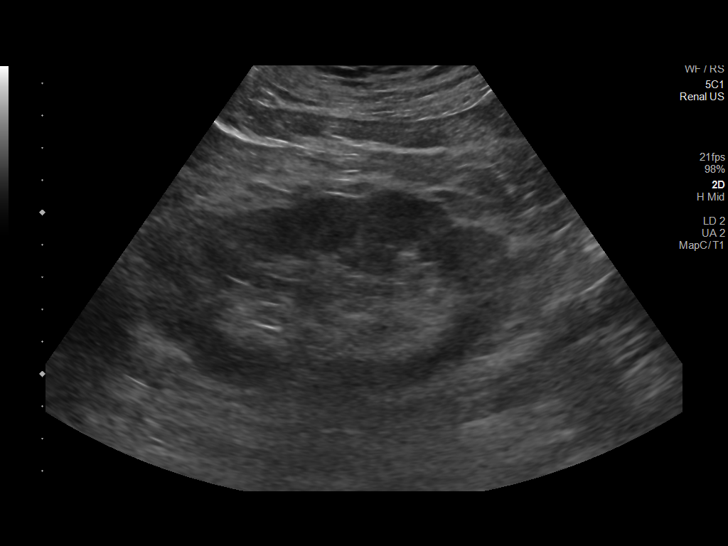
[im 3/22]
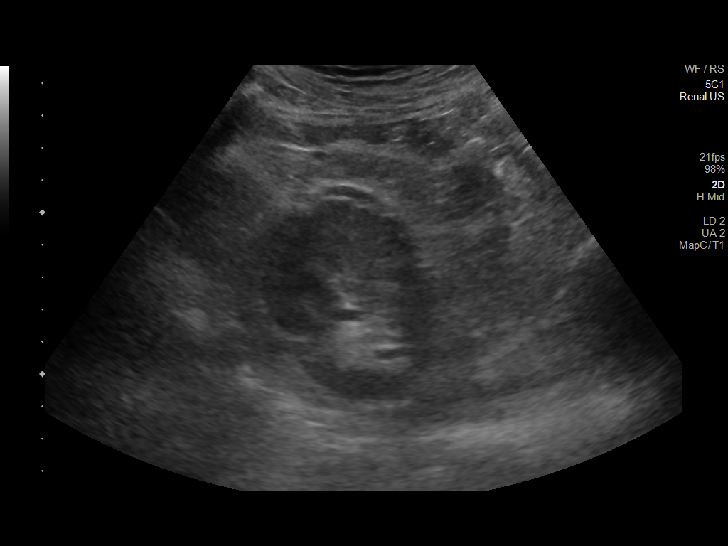
[im 5/22]
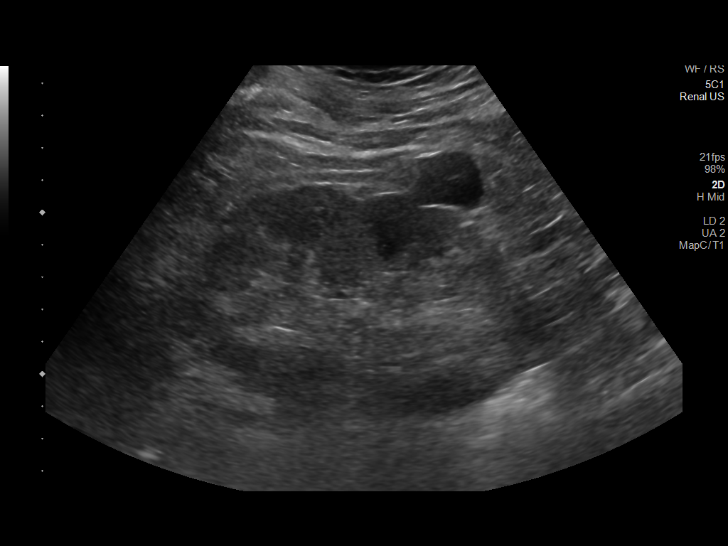
[im 6/22]
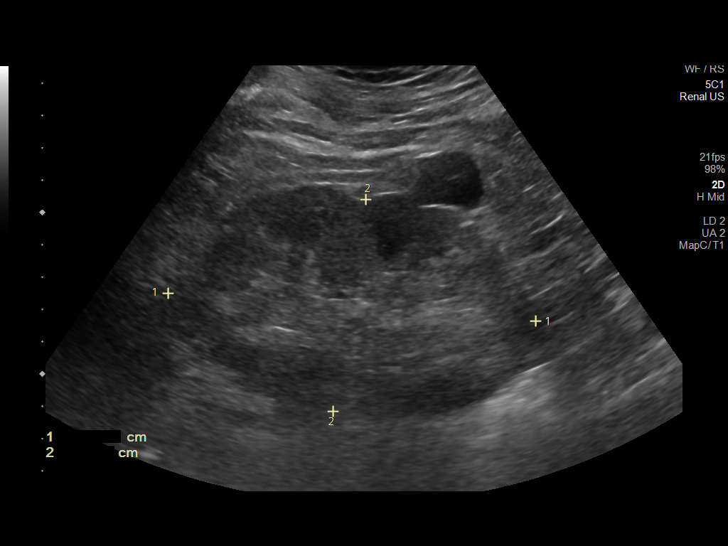
[im 8/22]
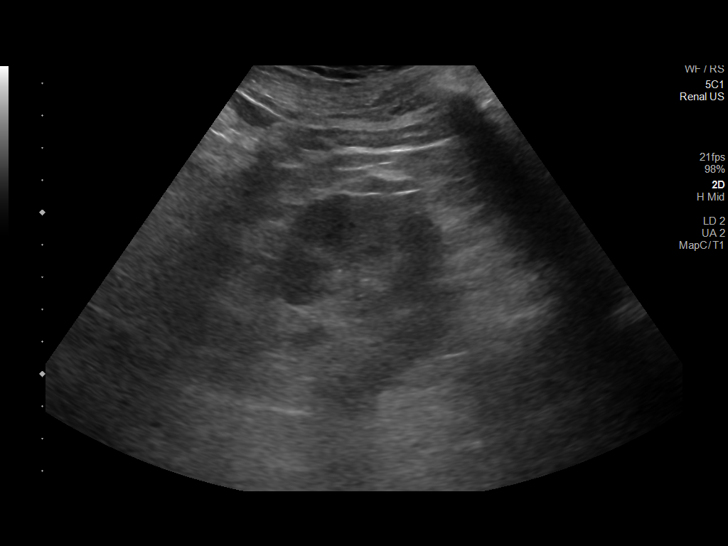
[im 10/22]
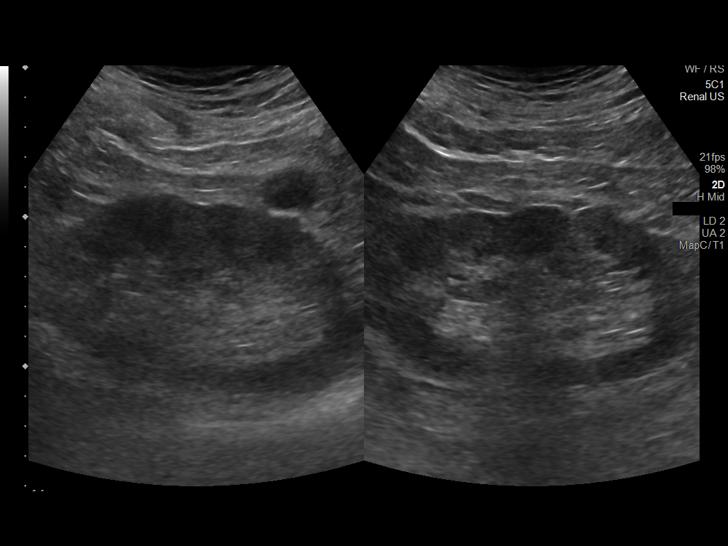
[im 12/22]
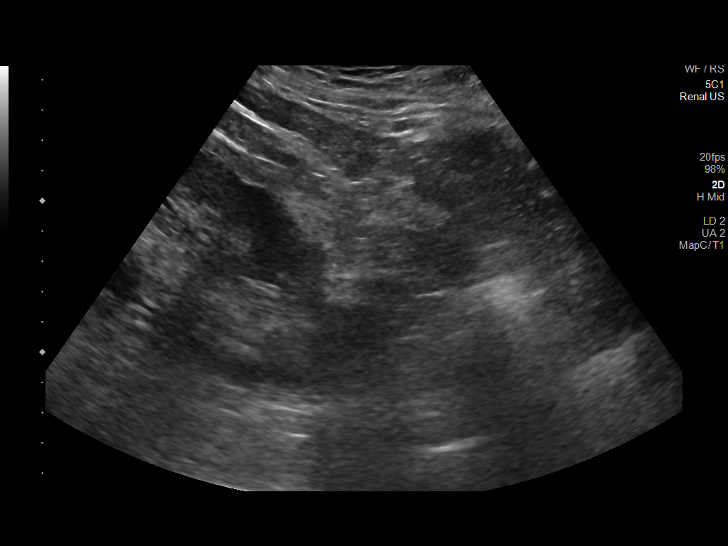
[im 13/22]
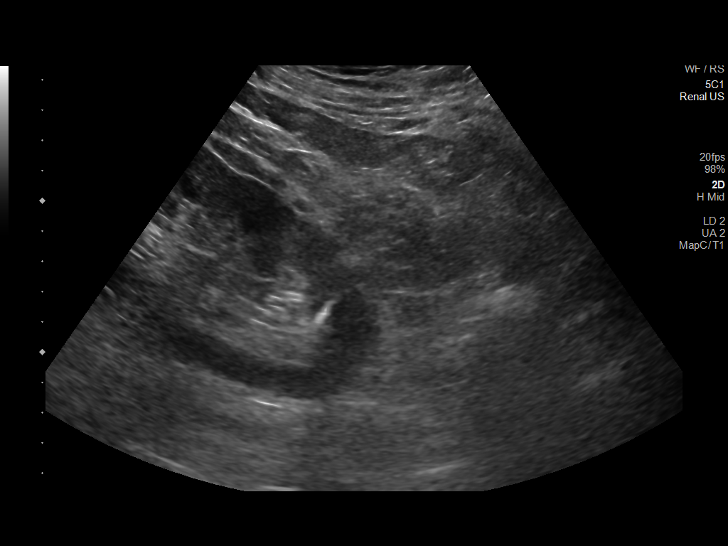
[im 15/22]
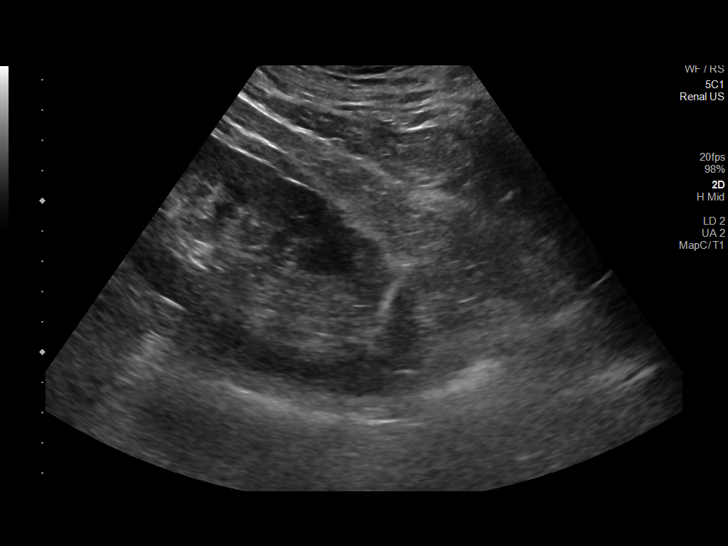
[im 17/22]
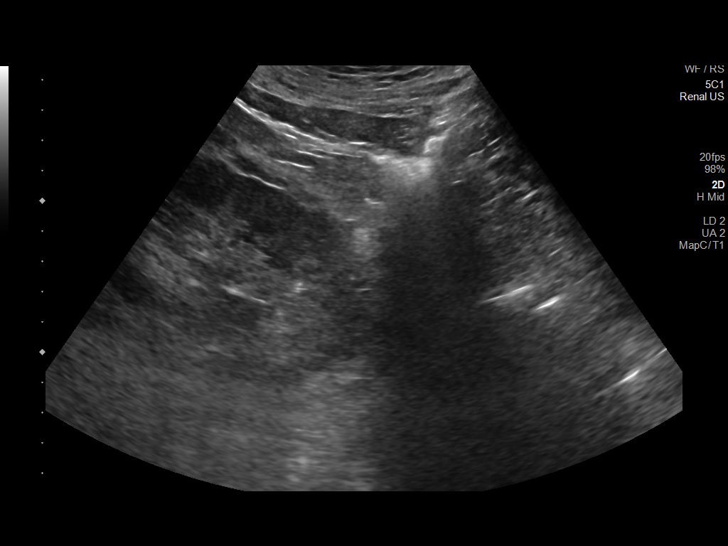
[im 18/22]
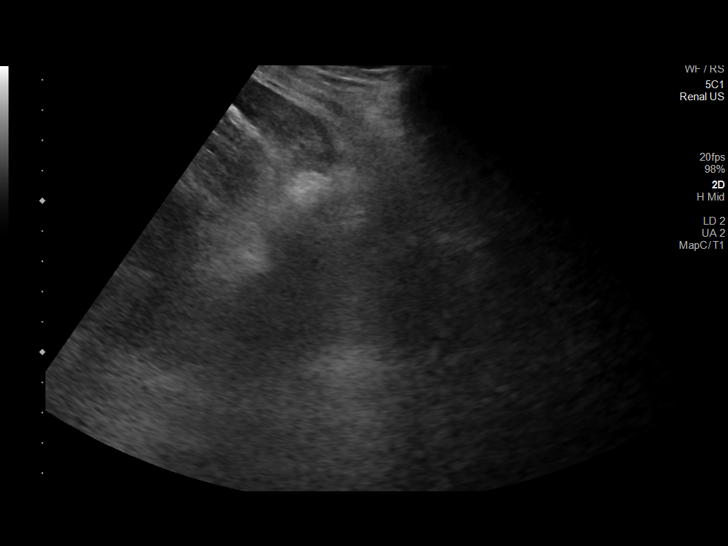
[im 20/22]
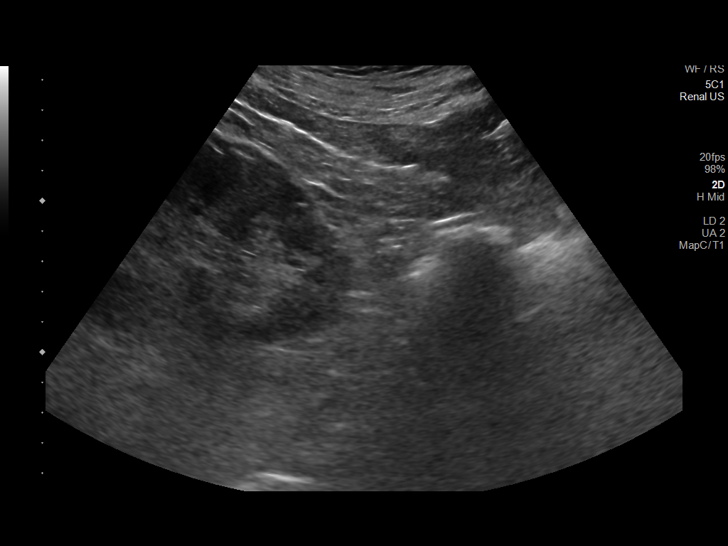
[im 22/22]
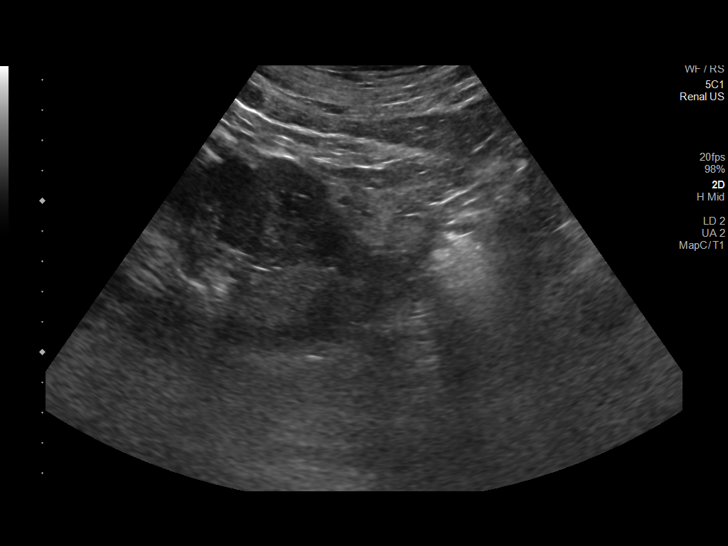

[13 of 22 positions shown; findings below may reference images not displayed]

EXAM:
Ultrasound-guided random renal biopsy

MEDICATIONS:
None.

ANESTHESIA/SEDATION:
Moderate (conscious) sedation was employed during this procedure. A
total of Versed 2 mg and Fentanyl 50 mcg was administered
intravenously.

Moderate Sedation Time: 15 minutes. The patient's level of
consciousness and vital signs were monitored continuously by
radiology nursing throughout the procedure under my direct
supervision.

FLUOROSCOPY TIME:  N/a

COMPLICATIONS:
None immediate.

PROCEDURE:
Informed written consent was obtained from the patient after a
thorough discussion of the procedural risks, benefits and
alternatives. All questions were addressed. Maximal Sterile Barrier
Technique was utilized including caps, mask, sterile gowns, sterile
gloves, sterile drape, hand hygiene and skin antiseptic. A timeout
was performed prior to the initiation of the procedure.

The patient was placed prone on the exam table. Limited ultrasound
of the bilateral flanks us performed for planning purposes.
Appropriate biopsy location was selected of the left renal lower
pole, and the overlying skin was prepped and draped in the standard
sterile fashion. Local analgesia was obtained with 1% lidocaine.
Using ultrasound guidance, a 15 gauge introducer needle was advanced
just deep to the cortex of the left renal lower pole. Subsequently,
core needle biopsy was performed using a 16 gauge core biopsy device
x2 passes. Specimens were submitted in saline for further healing.
Gel-Foam slurry was administered through the introducer needle as it
was removed. Limited postprocedure imaging demonstrated no
surrounding hematoma. A clean dressing was placed. The patient
tolerated the procedure well without immediate complication
IMPRESSION: Successful ultrasound-guided random renal biopsy of the left renal
lower pole.

## 2023-02-18 IMAGING — US US RENAL
1 series · 13 of 25 positions shown · non-contrast
Comparison: November 15, 2020.

CLINICAL DATA: Renal dysfunction. History of LEFT kidney biopsy
with blood in urine.

EXAM:
RENAL / URINARY TRACT ULTRASOUND COMPLETE

[Series 1: us renal · 0.23mm/px · 13 of 73 slices shown]
[im 1/73]
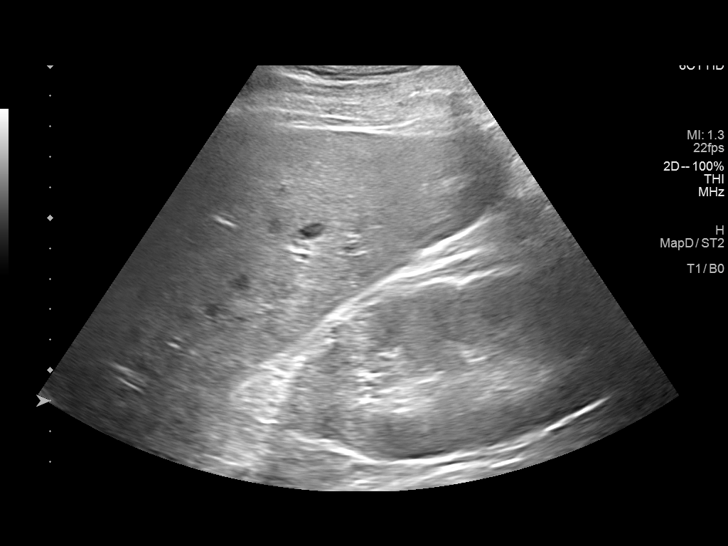
[im 7/73]
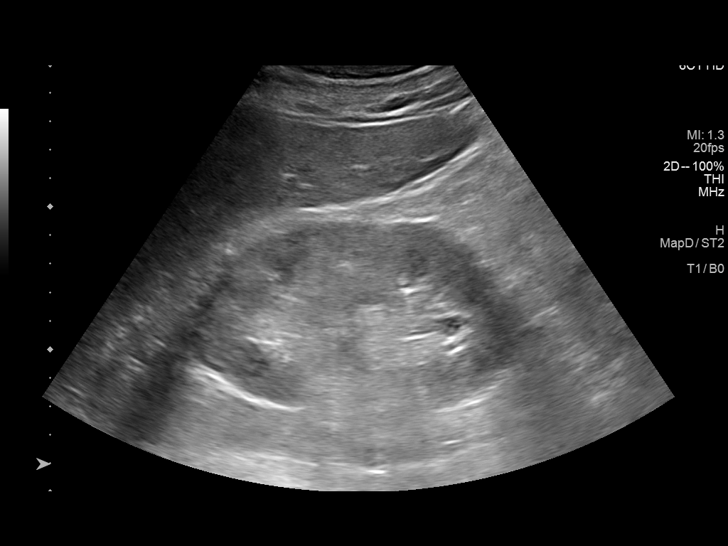
[im 13/73]
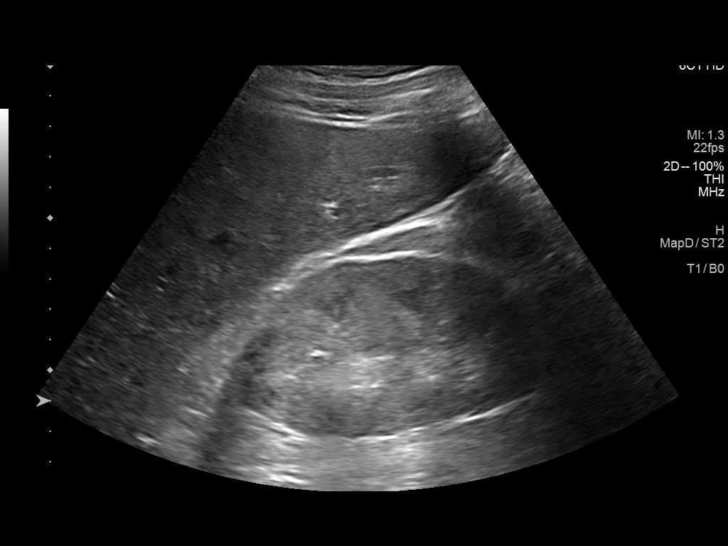
[im 19/73]
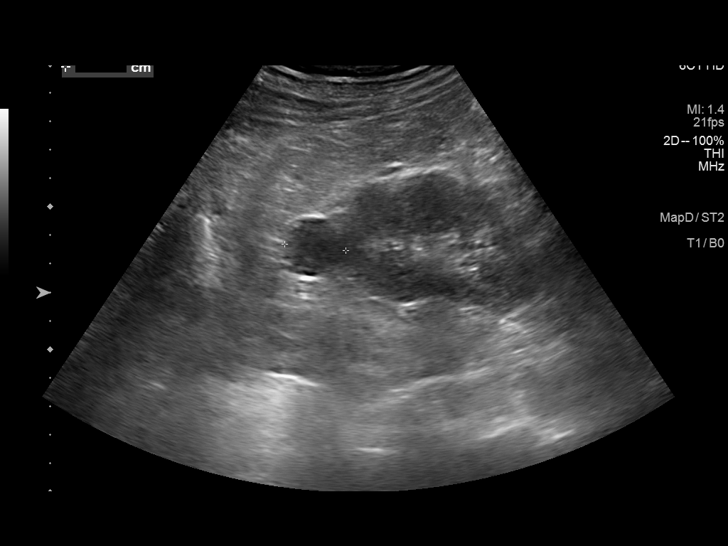
[im 25/73]
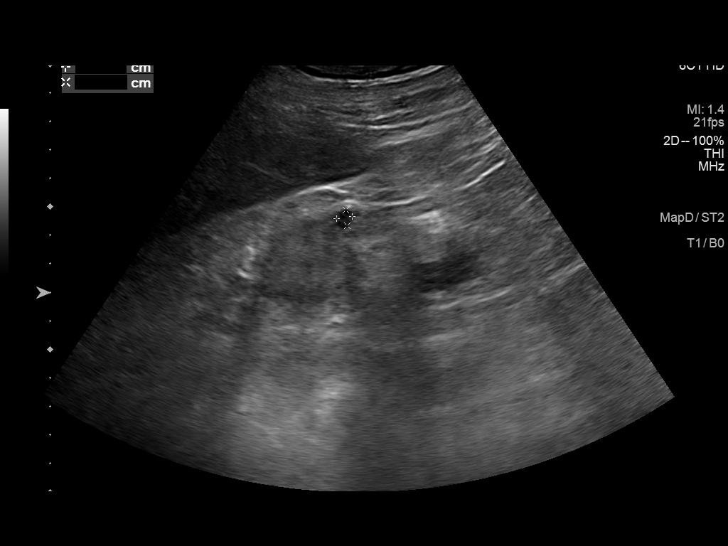
[im 31/73]
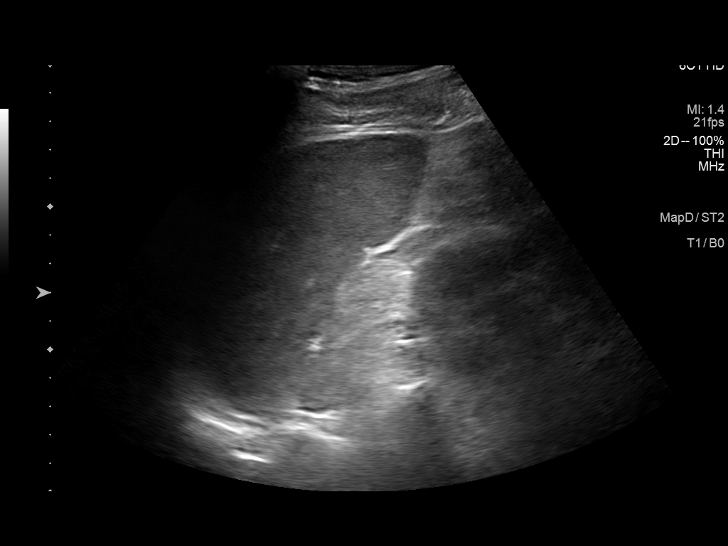
[im 37/73]
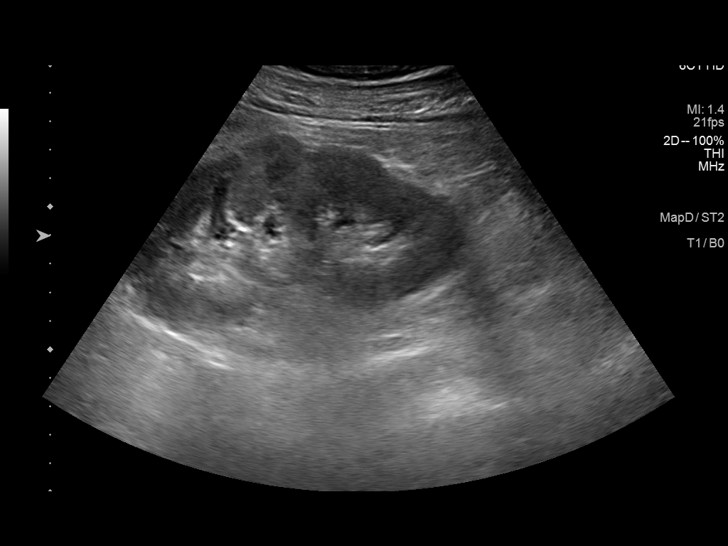
[im 43/73]
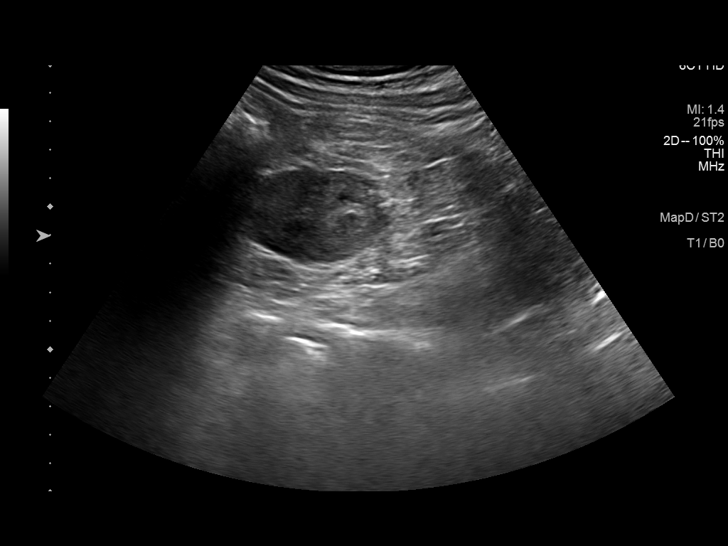
[im 49/73]
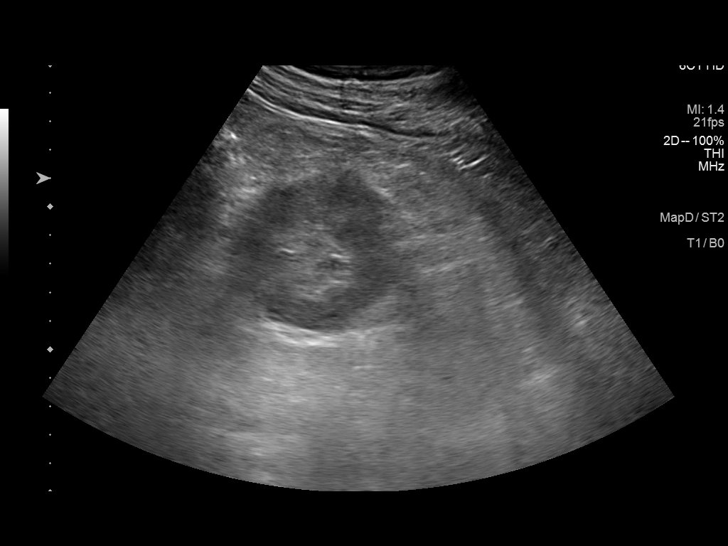
[im 55/73]
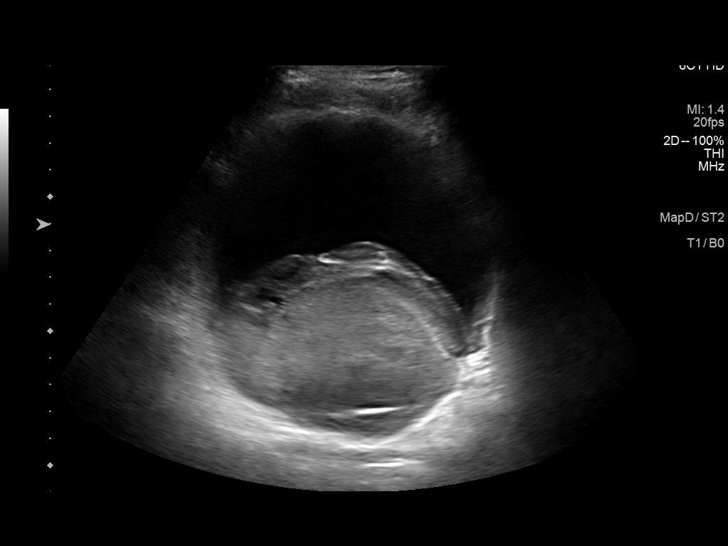
[im 61/73]
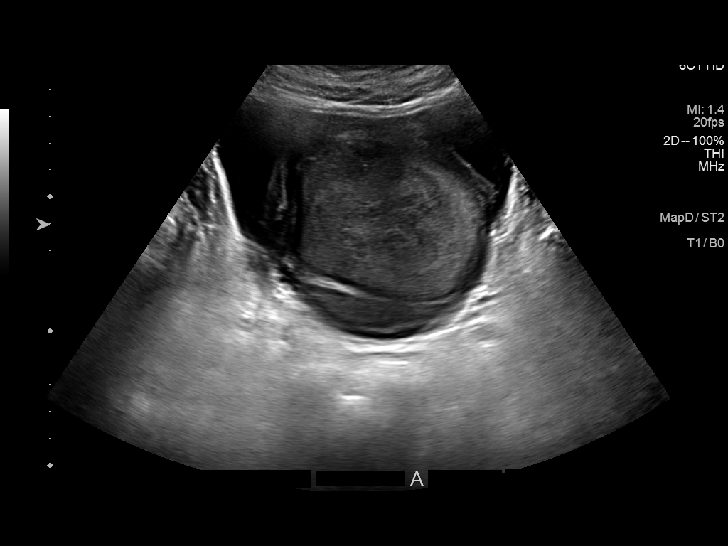
[im 67/73]
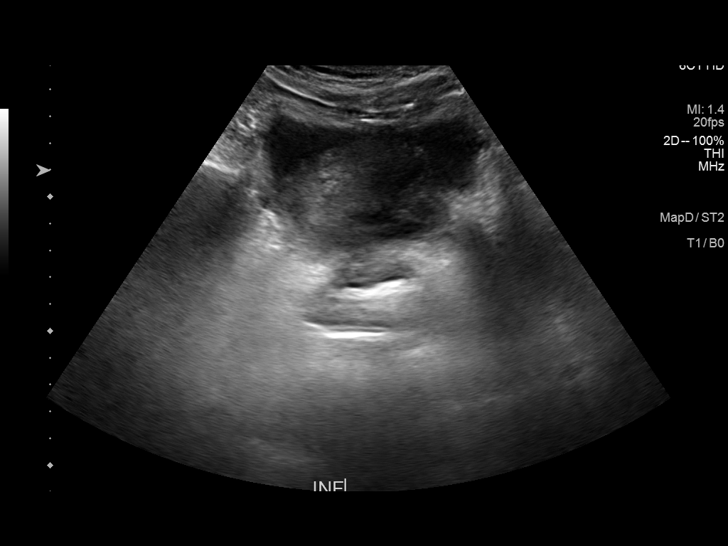
[im 73/73]
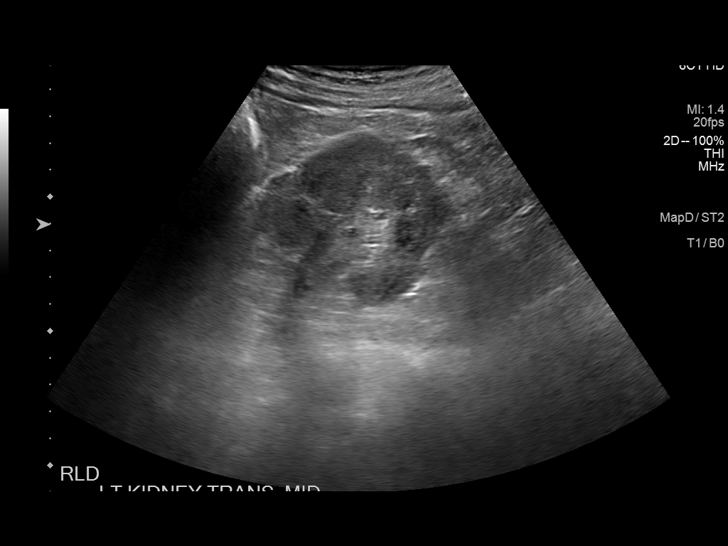

[13 of 25 positions shown; findings below may reference images not displayed]

FINDINGS: Right Kidney:

Renal measurements: 12.5 x 6.0 x 6.2 cm = volume: 242.2 mL. Mildly
increased echogenicity of the RIGHT kidney. Renal cysts. Largest
renal cyst in the lower pole measuring approximately 2.2 x 1.9 x
cm.

No hydronephrosis.

Left Kidney:

Renal measurements: 12.9 x 6.5 x 6.0 cm. = volume: 242 mL. Increased
cortical echogenicity with diminished corticomedullary
differentiation. No perinephric fluid or gross hematoma adjacent to
the kidney. Mild fullness of collecting systems perhaps slightly
decreased following voiding

Bladder:

Large filling defect in the urinary bladder measuring 7.7 x 7.4 x
9.4 cm without visible flow and new compared to the recent study
from November 15, 2020.

Other:

None.
IMPRESSION: Large hematoma in the urinary bladder as described.

Mild fullness of LEFT collecting systems slightly diminished
following voiding, no frank hydronephrosis. No perinephric fluid
collection visible on ultrasound.

Given constellation of above findings urologic consultation may be
helpful particularly if there is renal colic and signs of ongoing
bleeding.

Renal cysts on the RIGHT.

These results will be called to the ordering clinician or
representative by the Radiologist Assistant, and communication
documented in the PACS or [REDACTED].

## 2023-02-22 IMAGING — CT CT RENAL STONE PROTOCOL
2 of 4 series · 16 of 46 positions shown, 18 images · non-contrast
Comparison: None.

CLINICAL DATA: Hematuria following left kidney biopsy. Dysuria with
passage of clots. Large bladder clot noted on bladder sonogram.
Leukocytosis

EXAM:
CT ABDOMEN AND PELVIS WITHOUT CONTRAST
TECHNIQUE: Multidetector CT imaging of the abdomen and pelvis was performed
following the standard protocol without IV contrast.

[Series 2: axial st · axial · 0.85mm/px · z∈[+1042,+1482]mm · 13 of 98 slices shown, 15 images]
[im 5/98  soft-tissue]
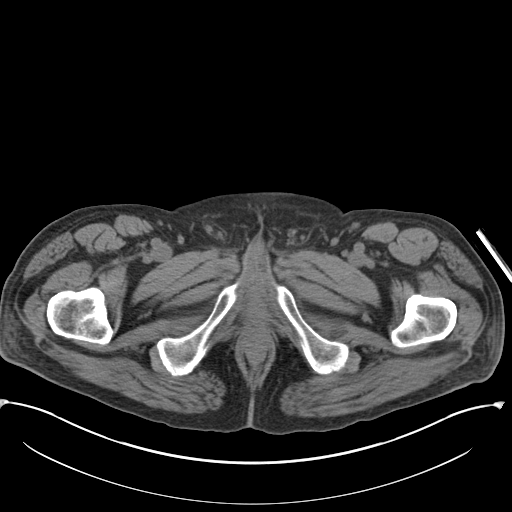
[im 5/98  bone]
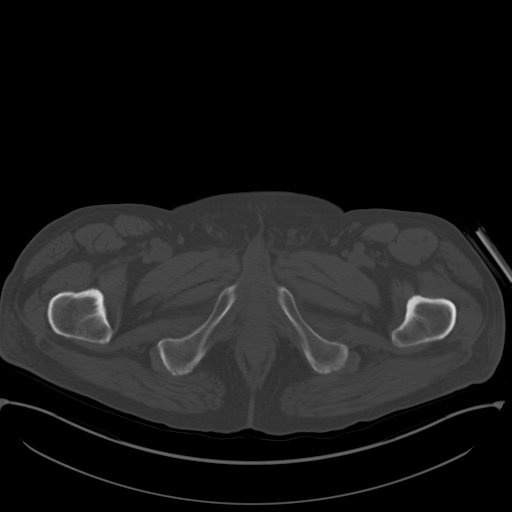
[im 14/98  soft-tissue]
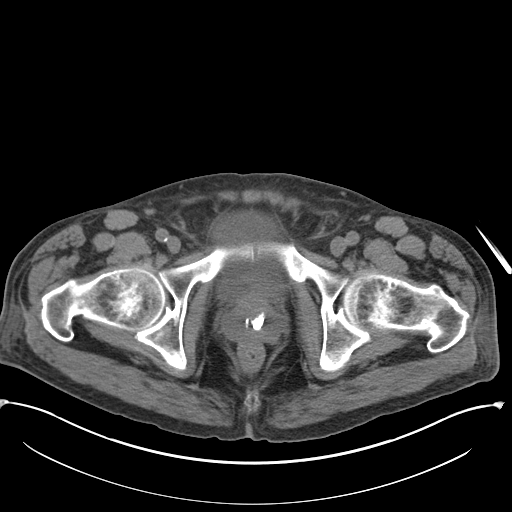
[im 19/98  soft-tissue]
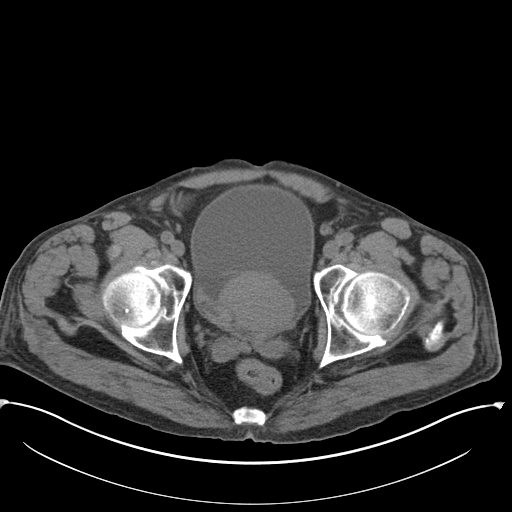
[im 28/98  soft-tissue]
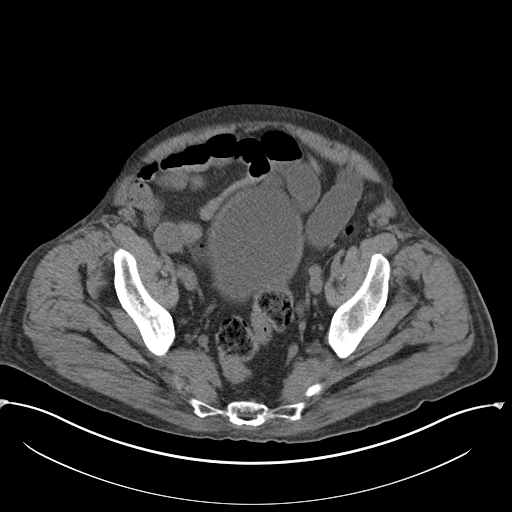
[im 33/98  soft-tissue]
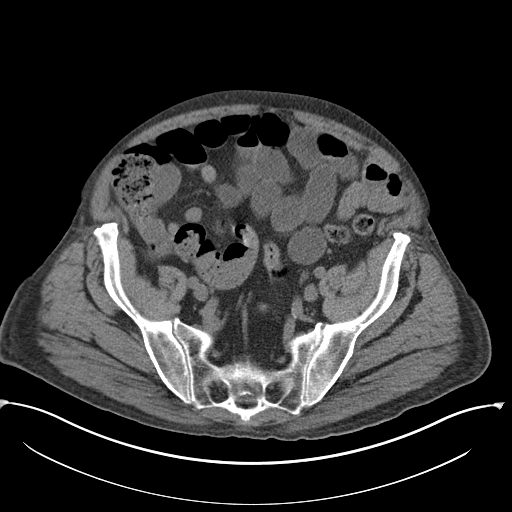
[im 42/98  soft-tissue]
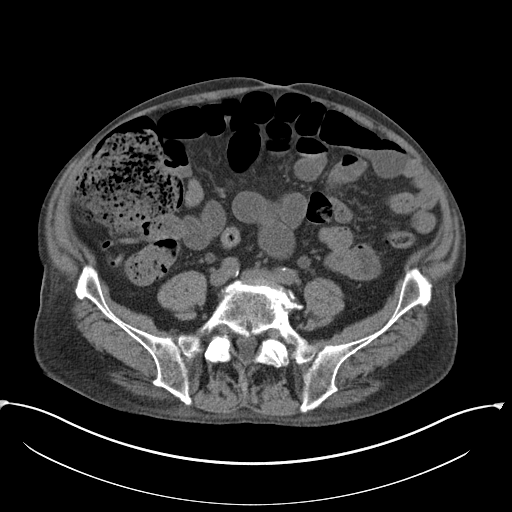
[im 51/98  soft-tissue]
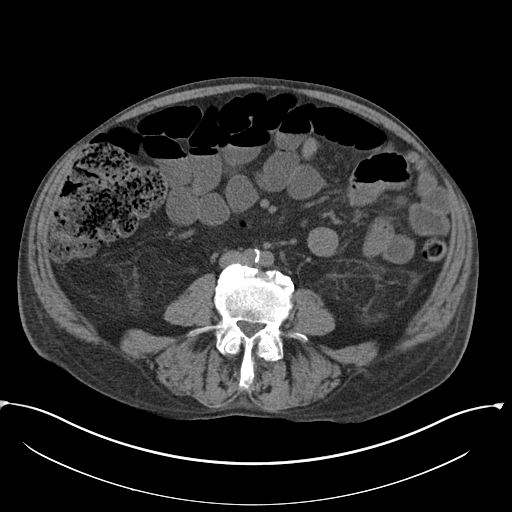
[im 56/98  soft-tissue]
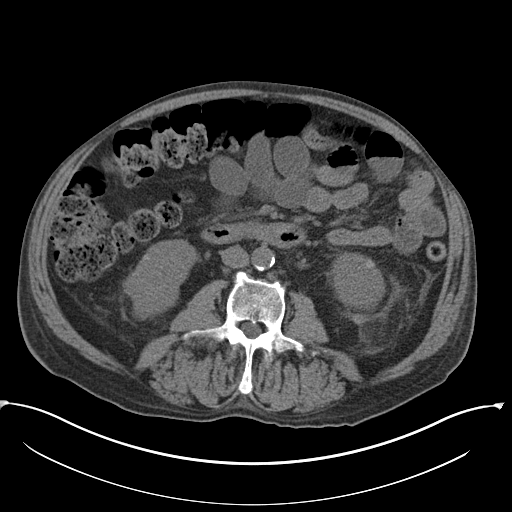
[im 65/98  soft-tissue]
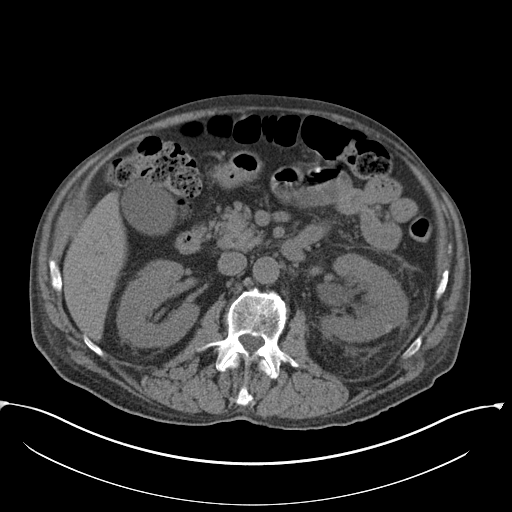
[im 65/98  bone]
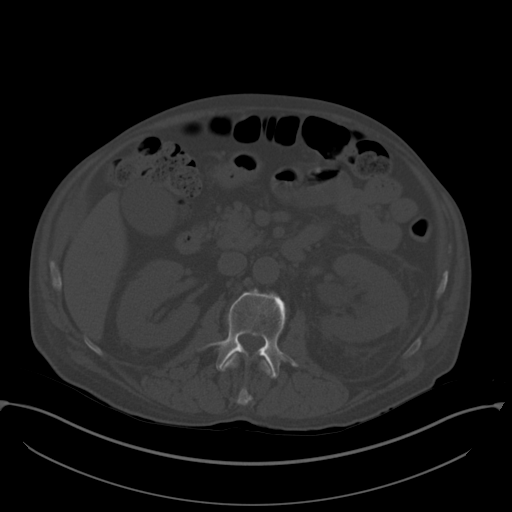
[im 70/98  soft-tissue]
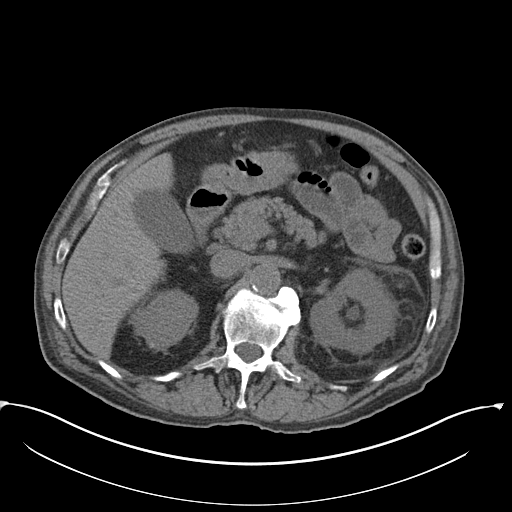
[im 79/98  soft-tissue]
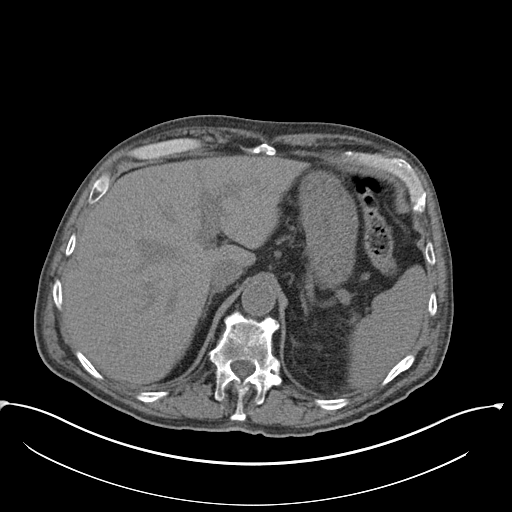
[im 84/98  soft-tissue]
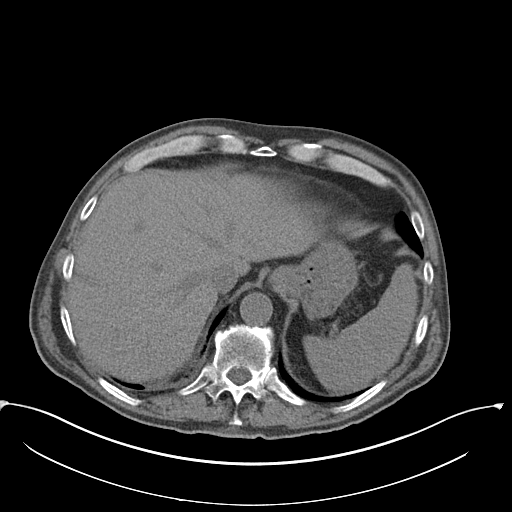
[im 93/98  soft-tissue]
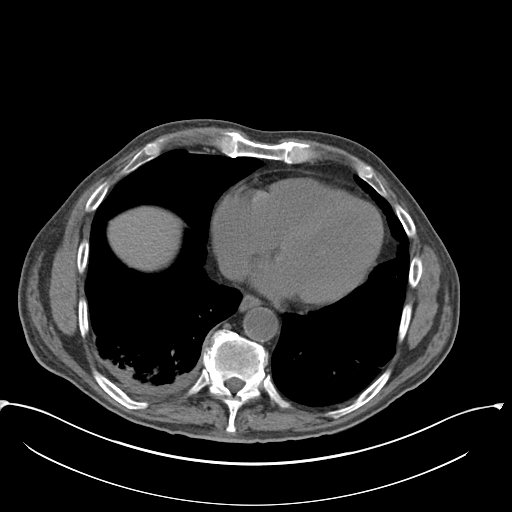

[Series 4: coronal · coronal · 0.81mm/px · 3 of 151 slices shown]
[im 51/151  soft-tissue]
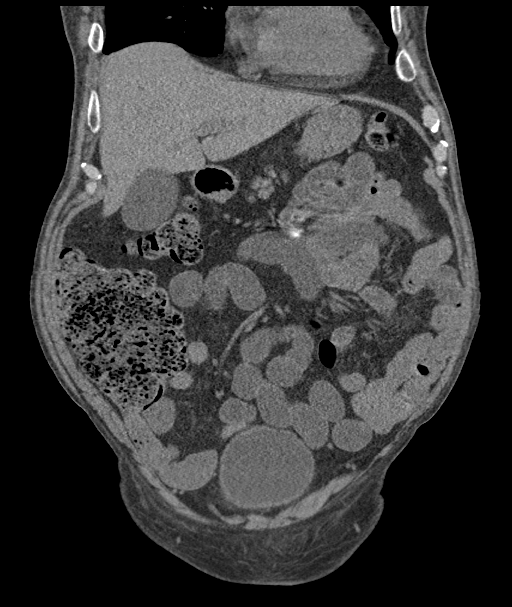
[im 67/151  soft-tissue]
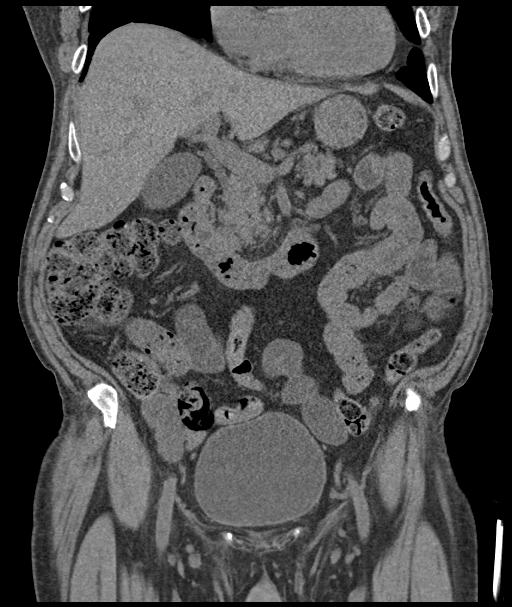
[im 84/151  soft-tissue]
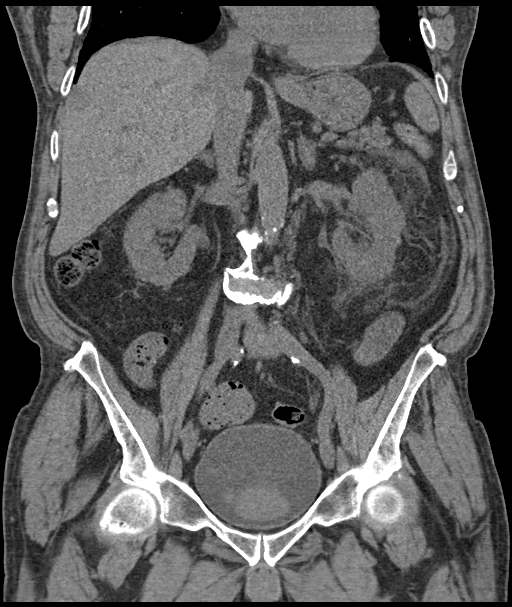

[16 of 46 positions shown; findings below may reference images not displayed]

FINDINGS: Lower chest: Right basilar pleural thickening and associated
posterobasal right lower lobe parenchymal scarring noted. The
visualized heart and pericardium are unremarkable.

Hepatobiliary: No focal liver abnormality is seen. No gallstones,
gallbladder wall thickening, or biliary dilatation.

Pancreas: Unremarkable

Spleen: Unremarkable

Adrenals/Urinary Tract: The adrenal glands are unremarkable. The
kidneys are normal in size and position. There is mild left
hydronephrosis and moderate left perinephric stranding. No
subcapsular hematoma or perirenal fluid collection is identified.
Minimal nonspecific right perinephric stranding noted. Exophytic
simple cortical cyst arises from the lower pole of the right kidney.
No nephro or urolithiasis. The bladder is distended with a
dependently layering blood clot measuring 6.4 x 4.6 x 6.3 cm.

Stomach/Bowel: Stomach is within normal limits. Appendix appears
normal. No evidence of bowel wall thickening, distention, or
inflammatory changes. No free intraperitoneal gas or fluid.

Vascular/Lymphatic: Moderate aortoiliac atherosclerotic
calcification. No aortic aneurysm. No pathologic adenopathy within
the abdomen and pelvis.

Reproductive: Prostate is unremarkable.

Other: Tiny bilateral fat containing inguinal hernias. Rectum
unremarkable.

Musculoskeletal: Degenerative changes are seen within the lumbar
spine. No acute bone abnormality. No lytic or blastic bone lesion.
IMPRESSION: 6.4 cm dependently layering blood clot within the bladder lumen.
Mild bladder distension suggests some degree of bladder outlet
obstruction. Mild left hydronephrosis and moderate left perinephric
stranding noted. No subcapsular hematoma or pararenal fluid
collection or hematoma identified, however. Note that the presence
of an underlying arteriovenous fistula, pseudoaneurysm, or
extravasation into the collecting system itself is not well assessed
in absence of contrast administration

Aortic Atherosclerosis ([id number]).

## 2023-04-06 DIAGNOSIS — N1831 Chronic kidney disease, stage 3a: Secondary | ICD-10-CM | POA: Diagnosis not present

## 2023-04-06 DIAGNOSIS — R809 Proteinuria, unspecified: Secondary | ICD-10-CM | POA: Diagnosis not present

## 2023-04-08 DIAGNOSIS — R0609 Other forms of dyspnea: Secondary | ICD-10-CM | POA: Diagnosis not present

## 2023-04-08 DIAGNOSIS — I129 Hypertensive chronic kidney disease with stage 1 through stage 4 chronic kidney disease, or unspecified chronic kidney disease: Secondary | ICD-10-CM | POA: Diagnosis not present

## 2023-04-08 DIAGNOSIS — N1831 Chronic kidney disease, stage 3a: Secondary | ICD-10-CM | POA: Diagnosis not present

## 2023-04-08 DIAGNOSIS — J3489 Other specified disorders of nose and nasal sinuses: Secondary | ICD-10-CM | POA: Diagnosis not present

## 2023-04-08 DIAGNOSIS — M3130 Wegener's granulomatosis without renal involvement: Secondary | ICD-10-CM | POA: Diagnosis not present

## 2023-06-08 DIAGNOSIS — E782 Mixed hyperlipidemia: Secondary | ICD-10-CM | POA: Diagnosis not present

## 2023-06-08 DIAGNOSIS — Z0000 Encounter for general adult medical examination without abnormal findings: Secondary | ICD-10-CM | POA: Diagnosis not present

## 2023-06-08 DIAGNOSIS — I10 Essential (primary) hypertension: Secondary | ICD-10-CM | POA: Diagnosis not present

## 2023-06-08 DIAGNOSIS — N1831 Chronic kidney disease, stage 3a: Secondary | ICD-10-CM | POA: Diagnosis not present

## 2023-06-15 DIAGNOSIS — I10 Essential (primary) hypertension: Secondary | ICD-10-CM | POA: Diagnosis not present

## 2023-06-15 DIAGNOSIS — G629 Polyneuropathy, unspecified: Secondary | ICD-10-CM | POA: Diagnosis not present

## 2023-06-15 DIAGNOSIS — E782 Mixed hyperlipidemia: Secondary | ICD-10-CM | POA: Diagnosis not present

## 2023-06-15 DIAGNOSIS — M25551 Pain in right hip: Secondary | ICD-10-CM | POA: Diagnosis not present

## 2023-06-15 DIAGNOSIS — I8393 Asymptomatic varicose veins of bilateral lower extremities: Secondary | ICD-10-CM | POA: Diagnosis not present

## 2023-06-15 DIAGNOSIS — Z0000 Encounter for general adult medical examination without abnormal findings: Secondary | ICD-10-CM | POA: Diagnosis not present

## 2023-06-15 DIAGNOSIS — I776 Arteritis, unspecified: Secondary | ICD-10-CM | POA: Diagnosis not present

## 2023-06-15 DIAGNOSIS — N1831 Chronic kidney disease, stage 3a: Secondary | ICD-10-CM | POA: Diagnosis not present

## 2023-06-25 DIAGNOSIS — N1831 Chronic kidney disease, stage 3a: Secondary | ICD-10-CM | POA: Diagnosis not present

## 2023-07-06 DIAGNOSIS — M3130 Wegener's granulomatosis without renal involvement: Secondary | ICD-10-CM | POA: Diagnosis not present

## 2023-07-06 DIAGNOSIS — I129 Hypertensive chronic kidney disease with stage 1 through stage 4 chronic kidney disease, or unspecified chronic kidney disease: Secondary | ICD-10-CM | POA: Diagnosis not present

## 2023-07-06 DIAGNOSIS — N1831 Chronic kidney disease, stage 3a: Secondary | ICD-10-CM | POA: Diagnosis not present

## 2023-07-27 DIAGNOSIS — L814 Other melanin hyperpigmentation: Secondary | ICD-10-CM | POA: Diagnosis not present

## 2023-07-27 DIAGNOSIS — Z85828 Personal history of other malignant neoplasm of skin: Secondary | ICD-10-CM | POA: Diagnosis not present

## 2023-07-27 DIAGNOSIS — Z08 Encounter for follow-up examination after completed treatment for malignant neoplasm: Secondary | ICD-10-CM | POA: Diagnosis not present

## 2023-07-27 DIAGNOSIS — Z789 Other specified health status: Secondary | ICD-10-CM | POA: Diagnosis not present

## 2023-07-27 DIAGNOSIS — R208 Other disturbances of skin sensation: Secondary | ICD-10-CM | POA: Diagnosis not present

## 2023-07-27 DIAGNOSIS — L821 Other seborrheic keratosis: Secondary | ICD-10-CM | POA: Diagnosis not present

## 2023-07-27 DIAGNOSIS — D225 Melanocytic nevi of trunk: Secondary | ICD-10-CM | POA: Diagnosis not present

## 2023-07-27 DIAGNOSIS — L538 Other specified erythematous conditions: Secondary | ICD-10-CM | POA: Diagnosis not present

## 2023-07-27 DIAGNOSIS — L570 Actinic keratosis: Secondary | ICD-10-CM | POA: Diagnosis not present

## 2023-07-27 DIAGNOSIS — L2989 Other pruritus: Secondary | ICD-10-CM | POA: Diagnosis not present

## 2023-07-27 DIAGNOSIS — L820 Inflamed seborrheic keratosis: Secondary | ICD-10-CM | POA: Diagnosis not present

## 2023-08-02 ENCOUNTER — Other Ambulatory Visit (HOSPITAL_COMMUNITY): Payer: Self-pay | Admitting: *Deleted

## 2023-08-03 ENCOUNTER — Ambulatory Visit (HOSPITAL_COMMUNITY)
Admission: RE | Admit: 2023-08-03 | Discharge: 2023-08-03 | Disposition: A | Discharge status: Home / Self Care | Source: Ambulatory Visit | Attending: Internal Medicine | Admitting: Internal Medicine

## 2023-08-03 DIAGNOSIS — N059 Unspecified nephritic syndrome with unspecified morphologic changes: Secondary | ICD-10-CM | POA: Insufficient documentation

## 2023-08-03 DIAGNOSIS — I7782 Antineutrophilic cytoplasmic antibody (ANCA) vasculitis: Secondary | ICD-10-CM | POA: Insufficient documentation

## 2023-08-03 DIAGNOSIS — Z79620 Long term (current) use of immunosuppressive biologic: Secondary | ICD-10-CM | POA: Diagnosis not present

## 2023-08-03 DIAGNOSIS — M3130 Wegener's granulomatosis without renal involvement: Secondary | ICD-10-CM | POA: Diagnosis not present

## 2023-08-03 MED ORDER — DIPHENHYDRAMINE HCL 50 MG/ML IJ SOLN
INTRAMUSCULAR | Status: AC
  Administered 2023-08-03: 25 mg via INTRAVENOUS
  Filled 2023-08-03: qty 1

## 2023-08-03 MED ORDER — DIPHENHYDRAMINE HCL 50 MG/ML IJ SOLN: 25.0000 mg | Freq: Once | INTRAMUSCULAR | Status: AC

## 2023-08-03 MED ORDER — METHYLPREDNISOLONE SODIUM SUCC 125 MG IJ SOLR
INTRAMUSCULAR | Status: AC
  Administered 2023-08-03: 125 mg via INTRAVENOUS
  Filled 2023-08-03: qty 2

## 2023-08-03 MED ORDER — METHYLPREDNISOLONE SODIUM SUCC 125 MG IJ SOLR: 125.0000 mg | Freq: Once | INTRAMUSCULAR | Status: AC

## 2023-08-03 MED ORDER — ACETAMINOPHEN 325 MG PO TABS
ORAL_TABLET | ORAL | Status: AC
  Administered 2023-08-03: 650 mg via ORAL
  Filled 2023-08-03: qty 2

## 2023-08-03 MED ORDER — SODIUM CHLORIDE 0.9 % IV SOLN
500.0000 mg | Freq: Once | INTRAVENOUS | Status: AC
  Administered 2023-08-03: 500 mg via INTRAVENOUS
  Filled 2023-08-03: qty 50

## 2023-08-03 MED ORDER — ACETAMINOPHEN 325 MG PO TABS: 650.0000 mg | ORAL_TABLET | Freq: Once | ORAL | Status: AC

## 2023-11-02 DIAGNOSIS — N1831 Chronic kidney disease, stage 3a: Secondary | ICD-10-CM | POA: Diagnosis not present

## 2023-11-10 DIAGNOSIS — N1831 Chronic kidney disease, stage 3a: Secondary | ICD-10-CM | POA: Diagnosis not present

## 2023-11-10 DIAGNOSIS — J3489 Other specified disorders of nose and nasal sinuses: Secondary | ICD-10-CM | POA: Diagnosis not present

## 2023-11-10 DIAGNOSIS — M3130 Wegener's granulomatosis without renal involvement: Secondary | ICD-10-CM | POA: Diagnosis not present

## 2023-11-10 DIAGNOSIS — I129 Hypertensive chronic kidney disease with stage 1 through stage 4 chronic kidney disease, or unspecified chronic kidney disease: Secondary | ICD-10-CM | POA: Diagnosis not present

## 2023-11-10 DIAGNOSIS — R918 Other nonspecific abnormal finding of lung field: Secondary | ICD-10-CM | POA: Diagnosis not present

## 2023-11-30 DIAGNOSIS — R0981 Nasal congestion: Secondary | ICD-10-CM | POA: Diagnosis not present

## 2024-01-26 DIAGNOSIS — L821 Other seborrheic keratosis: Secondary | ICD-10-CM | POA: Diagnosis not present

## 2024-01-26 DIAGNOSIS — Z08 Encounter for follow-up examination after completed treatment for malignant neoplasm: Secondary | ICD-10-CM | POA: Diagnosis not present

## 2024-01-26 DIAGNOSIS — C44529 Squamous cell carcinoma of skin of other part of trunk: Secondary | ICD-10-CM | POA: Diagnosis not present

## 2024-01-26 DIAGNOSIS — D485 Neoplasm of uncertain behavior of skin: Secondary | ICD-10-CM | POA: Diagnosis not present

## 2024-01-26 DIAGNOSIS — Z85828 Personal history of other malignant neoplasm of skin: Secondary | ICD-10-CM | POA: Diagnosis not present

## 2024-01-26 DIAGNOSIS — L570 Actinic keratosis: Secondary | ICD-10-CM | POA: Diagnosis not present

## 2024-01-26 DIAGNOSIS — D1801 Hemangioma of skin and subcutaneous tissue: Secondary | ICD-10-CM | POA: Diagnosis not present

## 2024-01-26 DIAGNOSIS — L814 Other melanin hyperpigmentation: Secondary | ICD-10-CM | POA: Diagnosis not present

## 2024-02-02 DIAGNOSIS — N1831 Chronic kidney disease, stage 3a: Secondary | ICD-10-CM | POA: Diagnosis not present

## 2024-02-02 DIAGNOSIS — R809 Proteinuria, unspecified: Secondary | ICD-10-CM | POA: Diagnosis not present

## 2024-02-08 DIAGNOSIS — I129 Hypertensive chronic kidney disease with stage 1 through stage 4 chronic kidney disease, or unspecified chronic kidney disease: Secondary | ICD-10-CM | POA: Diagnosis not present

## 2024-02-08 DIAGNOSIS — M3130 Wegener's granulomatosis without renal involvement: Secondary | ICD-10-CM | POA: Diagnosis not present

## 2024-02-08 DIAGNOSIS — N1831 Chronic kidney disease, stage 3a: Secondary | ICD-10-CM | POA: Diagnosis not present

## 2024-02-08 DIAGNOSIS — R5383 Other fatigue: Secondary | ICD-10-CM | POA: Diagnosis not present
